# Patient Record
Sex: Female | Born: 1950 | Race: White | Hispanic: No | Smoking: Former smoker
Health system: Southern US, Community
[De-identification: ages and names within clinical notes are randomized; demographics above are authoritative.]

## PROBLEM LIST (undated history)

## (undated) DIAGNOSIS — M1990 Unspecified osteoarthritis, unspecified site: Secondary | ICD-10-CM

## (undated) DIAGNOSIS — F419 Anxiety disorder, unspecified: Secondary | ICD-10-CM

## (undated) DIAGNOSIS — Z87442 Personal history of urinary calculi: Secondary | ICD-10-CM

## (undated) DIAGNOSIS — I10 Essential (primary) hypertension: Secondary | ICD-10-CM

## (undated) DIAGNOSIS — R011 Cardiac murmur, unspecified: Secondary | ICD-10-CM

## (undated) HISTORY — PX: TONSILLECTOMY: SUR1361

## (undated) HISTORY — PX: COLONOSCOPY: SHX174

## (undated) HISTORY — PX: DILATION AND CURETTAGE OF UTERUS: SHX78

## (undated) HISTORY — PX: KNEE ARTHROSCOPY: SUR90

---

## 2000-02-03 ENCOUNTER — Other Ambulatory Visit: Admission: RE | Admit: 2000-02-03 | Discharge: 2000-02-03 | Payer: Self-pay | Admitting: Obstetrics & Gynecology

## 2000-07-18 ENCOUNTER — Emergency Department (HOSPITAL_COMMUNITY): Admission: EM | Admit: 2000-07-18 | Discharge: 2000-07-18 | Payer: Self-pay | Admitting: *Deleted

## 2000-07-18 ENCOUNTER — Encounter: Payer: Self-pay | Admitting: *Deleted

## 2001-02-06 ENCOUNTER — Other Ambulatory Visit: Admission: RE | Admit: 2001-02-06 | Discharge: 2001-02-06 | Payer: Self-pay | Admitting: Obstetrics & Gynecology

## 2002-05-08 ENCOUNTER — Other Ambulatory Visit: Admission: RE | Admit: 2002-05-08 | Discharge: 2002-05-08 | Payer: Self-pay | Admitting: Obstetrics & Gynecology

## 2003-05-27 ENCOUNTER — Other Ambulatory Visit: Admission: RE | Admit: 2003-05-27 | Discharge: 2003-05-27 | Payer: Self-pay | Admitting: Obstetrics & Gynecology

## 2004-05-04 ENCOUNTER — Encounter: Admission: RE | Admit: 2004-05-04 | Discharge: 2004-05-04 | Payer: Self-pay | Admitting: Family Medicine

## 2004-08-09 ENCOUNTER — Other Ambulatory Visit: Admission: RE | Admit: 2004-08-09 | Discharge: 2004-08-09 | Payer: Self-pay | Admitting: Obstetrics & Gynecology

## 2005-05-15 ENCOUNTER — Encounter: Admission: RE | Admit: 2005-05-15 | Discharge: 2005-05-15 | Payer: Self-pay | Admitting: Family Medicine

## 2005-10-11 ENCOUNTER — Other Ambulatory Visit: Admission: RE | Admit: 2005-10-11 | Discharge: 2005-10-11 | Payer: Self-pay | Admitting: Obstetrics & Gynecology

## 2011-09-14 ENCOUNTER — Other Ambulatory Visit: Payer: Self-pay | Admitting: Obstetrics & Gynecology

## 2011-09-14 DIAGNOSIS — N644 Mastodynia: Secondary | ICD-10-CM

## 2011-09-28 ENCOUNTER — Ambulatory Visit
Admission: RE | Admit: 2011-09-28 | Discharge: 2011-09-28 | Disposition: A | Discharge status: Home / Self Care | Payer: 59 | Source: Ambulatory Visit | Attending: Obstetrics & Gynecology | Admitting: Obstetrics & Gynecology

## 2011-09-28 DIAGNOSIS — N644 Mastodynia: Secondary | ICD-10-CM

## 2012-08-24 ENCOUNTER — Other Ambulatory Visit: Payer: Self-pay | Admitting: Obstetrics & Gynecology

## 2012-08-24 DIAGNOSIS — Z1231 Encounter for screening mammogram for malignant neoplasm of breast: Secondary | ICD-10-CM

## 2012-09-28 ENCOUNTER — Ambulatory Visit
Admission: RE | Admit: 2012-09-28 | Discharge: 2012-09-28 | Disposition: A | Discharge status: Home / Self Care | Payer: 59 | Source: Ambulatory Visit | Attending: Obstetrics & Gynecology | Admitting: Obstetrics & Gynecology

## 2012-09-28 DIAGNOSIS — Z1231 Encounter for screening mammogram for malignant neoplasm of breast: Secondary | ICD-10-CM

## 2013-12-02 ENCOUNTER — Other Ambulatory Visit (HOSPITAL_COMMUNITY): Payer: Self-pay | Admitting: Orthopedic Surgery

## 2013-12-02 ENCOUNTER — Encounter (HOSPITAL_COMMUNITY): Payer: Self-pay | Admitting: *Deleted

## 2013-12-03 ENCOUNTER — Ambulatory Visit (HOSPITAL_COMMUNITY)
Admission: RE | Admit: 2013-12-03 | Discharge: 2013-12-03 | Disposition: A | Discharge status: Home / Self Care | Payer: 59 | Source: Ambulatory Visit | Attending: Orthopedic Surgery | Admitting: Orthopedic Surgery

## 2013-12-03 ENCOUNTER — Ambulatory Visit (HOSPITAL_COMMUNITY): Payer: 59

## 2013-12-03 ENCOUNTER — Encounter (HOSPITAL_COMMUNITY): Payer: Self-pay | Admitting: *Deleted

## 2013-12-03 ENCOUNTER — Ambulatory Visit (HOSPITAL_COMMUNITY): Payer: 59 | Admitting: Anesthesiology

## 2013-12-03 ENCOUNTER — Encounter (HOSPITAL_COMMUNITY)
Admission: RE | Disposition: A | Discharge status: Home / Self Care | Payer: Self-pay | Source: Ambulatory Visit | Attending: Orthopedic Surgery

## 2013-12-03 ENCOUNTER — Encounter (HOSPITAL_COMMUNITY): Payer: 59 | Admitting: Anesthesiology

## 2013-12-03 DIAGNOSIS — S42209A Unspecified fracture of upper end of unspecified humerus, initial encounter for closed fracture: Secondary | ICD-10-CM | POA: Insufficient documentation

## 2013-12-03 DIAGNOSIS — W19XXXA Unspecified fall, initial encounter: Secondary | ICD-10-CM | POA: Insufficient documentation

## 2013-12-03 DIAGNOSIS — Z87891 Personal history of nicotine dependence: Secondary | ICD-10-CM | POA: Insufficient documentation

## 2013-12-03 DIAGNOSIS — I10 Essential (primary) hypertension: Secondary | ICD-10-CM | POA: Insufficient documentation

## 2013-12-03 HISTORY — PX: ORIF HUMERUS FRACTURE: SHX2126

## 2013-12-03 HISTORY — DX: Essential (primary) hypertension: I10

## 2013-12-03 HISTORY — DX: Unspecified osteoarthritis, unspecified site: M19.90

## 2013-12-03 LAB — CBC
HEMATOCRIT: 35.7 % — AB (ref 36.0–46.0)
Hemoglobin: 12.2 g/dL (ref 12.0–15.0)
MCH: 30.7 pg (ref 26.0–34.0)
MCHC: 34.2 g/dL (ref 30.0–36.0)
MCV: 89.7 fL (ref 78.0–100.0)
Platelets: 345 10*3/uL (ref 150–400)
RBC: 3.98 MIL/uL (ref 3.87–5.11)
RDW: 12.1 % (ref 11.5–15.5)
WBC: 7.9 10*3/uL (ref 4.0–10.5)

## 2013-12-03 LAB — BASIC METABOLIC PANEL
BUN: 21 mg/dL (ref 6–23)
CHLORIDE: 101 meq/L (ref 96–112)
CO2: 24 meq/L (ref 19–32)
CREATININE: 1.44 mg/dL — AB (ref 0.50–1.10)
Calcium: 9.7 mg/dL (ref 8.4–10.5)
GFR calc Af Amer: 44 mL/min — ABNORMAL LOW (ref >90)
GFR calc non Af Amer: 38 mL/min — ABNORMAL LOW (ref >90)
Glucose, Bld: 114 mg/dL — ABNORMAL HIGH (ref 70–99)
Potassium: 4.3 mEq/L (ref 3.7–5.3)
Sodium: 140 mEq/L (ref 137–147)

## 2013-12-03 SURGERY — OPEN REDUCTION INTERNAL FIXATION (ORIF) PROXIMAL HUMERUS FRACTURE
Anesthesia: General | Site: Shoulder | Laterality: Right

## 2013-12-03 MED ORDER — HYDROMORPHONE HCL PF 1 MG/ML IJ SOLN: 0.2500 mg | INTRAMUSCULAR | Status: DC | PRN

## 2013-12-03 MED ORDER — PROPOFOL 10 MG/ML IV BOLUS
INTRAVENOUS | Status: AC
  Filled 2013-12-03: qty 20

## 2013-12-03 MED ORDER — LACTATED RINGERS IV SOLN
INTRAVENOUS | Status: DC | PRN
  Administered 2013-12-03 (×2): via INTRAVENOUS

## 2013-12-03 MED ORDER — GLYCOPYRROLATE 0.2 MG/ML IJ SOLN
INTRAMUSCULAR | Status: AC
  Filled 2013-12-03: qty 2

## 2013-12-03 MED ORDER — ONDANSETRON HCL 4 MG/2ML IJ SOLN
INTRAMUSCULAR | Status: DC | PRN
  Administered 2013-12-03: 4 mg via INTRAVENOUS

## 2013-12-03 MED ORDER — PROMETHAZINE HCL 25 MG/ML IJ SOLN: 6.2500 mg | INTRAMUSCULAR | Status: DC | PRN

## 2013-12-03 MED ORDER — DEXAMETHASONE SODIUM PHOSPHATE 4 MG/ML IJ SOLN
INTRAMUSCULAR | Status: AC
  Filled 2013-12-03: qty 1

## 2013-12-03 MED ORDER — EPHEDRINE SULFATE 50 MG/ML IJ SOLN
INTRAMUSCULAR | Status: AC
  Filled 2013-12-03: qty 1

## 2013-12-03 MED ORDER — MIDAZOLAM HCL 2 MG/2ML IJ SOLN
INTRAMUSCULAR | Status: AC
  Filled 2013-12-03: qty 2

## 2013-12-03 MED ORDER — OXYCODONE-ACETAMINOPHEN 10-325 MG PO TABS: 1.0000 | ORAL_TABLET | Freq: Four times a day (QID) | ORAL | Status: DC | PRN

## 2013-12-03 MED ORDER — DEXAMETHASONE SODIUM PHOSPHATE 4 MG/ML IJ SOLN
INTRAMUSCULAR | Status: DC | PRN
  Administered 2013-12-03: 4 mg via INTRAVENOUS

## 2013-12-03 MED ORDER — BUPIVACAINE-EPINEPHRINE PF 0.5-1:200000 % IJ SOLN
INTRAMUSCULAR | Status: DC | PRN
  Administered 2013-12-03: 25 mL via PERINEURAL

## 2013-12-03 MED ORDER — MIDAZOLAM HCL 2 MG/2ML IJ SOLN
INTRAMUSCULAR | Status: AC
  Administered 2013-12-03: 1 mg via INTRAVENOUS
  Filled 2013-12-03: qty 2

## 2013-12-03 MED ORDER — GLYCOPYRROLATE 0.2 MG/ML IJ SOLN
INTRAMUSCULAR | Status: DC | PRN
  Administered 2013-12-03: 0.4 mg via INTRAVENOUS

## 2013-12-03 MED ORDER — ROCURONIUM BROMIDE 100 MG/10ML IV SOLN
INTRAVENOUS | Status: DC | PRN
  Administered 2013-12-03: 30 mg via INTRAVENOUS

## 2013-12-03 MED ORDER — PHENYLEPHRINE HCL 10 MG/ML IJ SOLN
INTRAMUSCULAR | Status: DC | PRN
  Administered 2013-12-03 (×2): 80 ug via INTRAVENOUS
  Administered 2013-12-03: 120 ug via INTRAVENOUS

## 2013-12-03 MED ORDER — PROPOFOL 10 MG/ML IV BOLUS
INTRAVENOUS | Status: DC | PRN
  Administered 2013-12-03: 200 mg via INTRAVENOUS

## 2013-12-03 MED ORDER — EPHEDRINE SULFATE 50 MG/ML IJ SOLN
INTRAMUSCULAR | Status: DC | PRN
  Administered 2013-12-03 (×2): 15 mg via INTRAVENOUS
  Administered 2013-12-03: 10 mg via INTRAVENOUS

## 2013-12-03 MED ORDER — LIDOCAINE HCL (CARDIAC) 20 MG/ML IV SOLN
INTRAVENOUS | Status: AC
  Filled 2013-12-03: qty 5

## 2013-12-03 MED ORDER — FENTANYL CITRATE 0.05 MG/ML IJ SOLN
INTRAMUSCULAR | Status: AC
  Administered 2013-12-03: 50 ug via INTRAVENOUS
  Filled 2013-12-03: qty 2

## 2013-12-03 MED ORDER — FENTANYL CITRATE 0.05 MG/ML IJ SOLN
INTRAMUSCULAR | Status: AC
  Filled 2013-12-03: qty 5

## 2013-12-03 MED ORDER — PHENYLEPHRINE HCL 10 MG/ML IJ SOLN
INTRAMUSCULAR | Status: AC
  Filled 2013-12-03: qty 1

## 2013-12-03 MED ORDER — LACTATED RINGERS IV SOLN
INTRAVENOUS | Status: DC
  Administered 2013-12-03: 13:00:00 via INTRAVENOUS

## 2013-12-03 MED ORDER — NEOSTIGMINE METHYLSULFATE 1 MG/ML IJ SOLN
INTRAMUSCULAR | Status: AC
  Filled 2013-12-03: qty 10

## 2013-12-03 MED ORDER — FENTANYL CITRATE 0.05 MG/ML IJ SOLN
50.0000 ug | INTRAMUSCULAR | Status: DC | PRN
  Administered 2013-12-03: 50 ug via INTRAVENOUS

## 2013-12-03 MED ORDER — ONDANSETRON HCL 4 MG/2ML IJ SOLN
INTRAMUSCULAR | Status: AC
  Filled 2013-12-03: qty 2

## 2013-12-03 MED ORDER — PHENYLEPHRINE HCL 10 MG/ML IJ SOLN
10.0000 mg | INTRAVENOUS | Status: DC | PRN
  Administered 2013-12-03: 50 ug/min via INTRAVENOUS

## 2013-12-03 MED ORDER — CEFAZOLIN SODIUM-DEXTROSE 2-3 GM-% IV SOLR
INTRAVENOUS | Status: DC | PRN
  Administered 2013-12-03: 2 g via INTRAVENOUS

## 2013-12-03 MED ORDER — NEOSTIGMINE METHYLSULFATE 1 MG/ML IJ SOLN
INTRAMUSCULAR | Status: DC | PRN
  Administered 2013-12-03: 3 mg via INTRAVENOUS

## 2013-12-03 MED ORDER — MIDAZOLAM HCL 2 MG/2ML IJ SOLN
1.0000 mg | INTRAMUSCULAR | Status: DC | PRN
  Administered 2013-12-03: 1 mg via INTRAVENOUS

## 2013-12-03 MED ORDER — CEFAZOLIN SODIUM-DEXTROSE 2-3 GM-% IV SOLR
INTRAVENOUS | Status: AC
  Filled 2013-12-03: qty 50

## 2013-12-03 MED ORDER — FENTANYL CITRATE 0.05 MG/ML IJ SOLN
INTRAMUSCULAR | Status: DC | PRN
  Administered 2013-12-03: 50 ug via INTRAVENOUS
  Administered 2013-12-03: 100 ug via INTRAVENOUS

## 2013-12-03 MED ORDER — LIDOCAINE HCL (CARDIAC) 20 MG/ML IV SOLN
INTRAVENOUS | Status: DC | PRN
  Administered 2013-12-03: 100 mg via INTRAVENOUS

## 2013-12-03 MED ORDER — STERILE WATER FOR INJECTION IJ SOLN
INTRAMUSCULAR | Status: AC
  Filled 2013-12-03: qty 10

## 2013-12-03 MED ORDER — ROCURONIUM BROMIDE 50 MG/5ML IV SOLN
INTRAVENOUS | Status: AC
  Filled 2013-12-03: qty 1

## 2013-12-03 SURGICAL SUPPLY — 67 items
ANCHOR KNTLS PEEK 4.5 ALLTHRD (Anchor) ×2 IMPLANT
APL SKNCLS STERI-STRIP NONHPOA (GAUZE/BANDAGES/DRESSINGS) ×1
BANDAGE ELASTIC 4 VELCRO ST LF (GAUZE/BANDAGES/DRESSINGS) IMPLANT
BANDAGE ELASTIC 6 VELCRO ST LF (GAUZE/BANDAGES/DRESSINGS) IMPLANT
BENZOIN TINCTURE PRP APPL 2/3 (GAUZE/BANDAGES/DRESSINGS) ×2 IMPLANT
BIT DRILL 2.9 CANN QC NONSTRL (BIT) ×1 IMPLANT
BIT DRILL 2.9X70 QC CALB (BIT) ×1 IMPLANT
BNDG COHESIVE 4X5 TAN STRL (GAUZE/BANDAGES/DRESSINGS) ×2 IMPLANT
CLOTH BEACON ORANGE TIMEOUT ST (SAFETY) ×2 IMPLANT
CLSR STERI-STRIP ANTIMIC 1/2X4 (GAUZE/BANDAGES/DRESSINGS) ×1 IMPLANT
COVER SURGICAL LIGHT HANDLE (MISCELLANEOUS) ×2 IMPLANT
DRAIN PENROSE 1/2X12 LTX STRL (WOUND CARE) IMPLANT
DRAPE C-ARM 42X72 X-RAY (DRAPES) IMPLANT
DRAPE U-SHAPE 47X51 STRL (DRAPES) ×2 IMPLANT
DRSG MEPILEX BORDER 4X8 (GAUZE/BANDAGES/DRESSINGS) ×1 IMPLANT
DRSG PAD ABDOMINAL 8X10 ST (GAUZE/BANDAGES/DRESSINGS) IMPLANT
DURAPREP 26ML APPLICATOR (WOUND CARE) ×2 IMPLANT
ELECT REM PT RETURN 9FT ADLT (ELECTROSURGICAL) ×2
ELECTRODE REM PT RTRN 9FT ADLT (ELECTROSURGICAL) ×1 IMPLANT
FACESHIELD LNG OPTICON STERILE (SAFETY) ×2 IMPLANT
GAUZE XEROFORM 5X9 LF (GAUZE/BANDAGES/DRESSINGS) ×1 IMPLANT
GLOVE BIOGEL PI IND STRL 8 (GLOVE) ×1 IMPLANT
GLOVE BIOGEL PI INDICATOR 8 (GLOVE) ×1
GLOVE SURG ORTHO 8.0 STRL STRW (GLOVE) ×2 IMPLANT
GOWN PREVENTION PLUS LG XLONG (DISPOSABLE) IMPLANT
GOWN PREVENTION PLUS XLARGE (GOWN DISPOSABLE) ×2 IMPLANT
GOWN STRL NON-REIN LRG LVL3 (GOWN DISPOSABLE) ×4 IMPLANT
K-WIRE ACE 1.6X6 (WIRE) ×8
KIT BASIN OR (CUSTOM PROCEDURE TRAY) ×2 IMPLANT
KIT BIO-TENODESIS 3X8 DISP (MISCELLANEOUS) ×2
KIT INSRT BABSR STRL DISP BTN (MISCELLANEOUS) IMPLANT
KIT ROOM TURNOVER OR (KITS) ×2 IMPLANT
KWIRE ACE 1.6X6 (WIRE) IMPLANT
MANIFOLD NEPTUNE II (INSTRUMENTS) ×2 IMPLANT
NDL 1/2 CIR CATGUT .05X1.09 (NEEDLE) IMPLANT
NEEDLE 1/2 CIR CATGUT .05X1.09 (NEEDLE) ×2 IMPLANT
NEEDLE 21X1 OR PACK (NEEDLE) IMPLANT
NS IRRIG 1000ML POUR BTL (IV SOLUTION) ×2 IMPLANT
PACK SHOULDER (CUSTOM PROCEDURE TRAY) ×2 IMPLANT
PAD ARMBOARD 7.5X6 YLW CONV (MISCELLANEOUS) ×4 IMPLANT
PAD CAST 4YDX4 CTTN HI CHSV (CAST SUPPLIES) IMPLANT
PADDING CAST COTTON 4X4 STRL (CAST SUPPLIES)
PENCIL BUTTON HOLSTER BLD 10FT (ELECTRODE) IMPLANT
PLATE SPIDER 16 (Washer) ×1 IMPLANT
SCREW ACE CAN 4.0 38M (Screw) ×1 IMPLANT
SCREW ACE CAN 4.0 44M (Screw) ×1 IMPLANT
SCREW LAG  RD HEAD 4.0 44 LTH (Screw) ×1 IMPLANT
SCREW LAG RD HEAD 4.0 44 LTH (Screw) IMPLANT
SLING ARM IMMOBILIZER LRG (SOFTGOODS) ×1 IMPLANT
SPONGE GAUZE 4X4 12PLY (GAUZE/BANDAGES/DRESSINGS) IMPLANT
SPONGE LAP 4X18 X RAY DECT (DISPOSABLE) ×4 IMPLANT
STAPLER VISISTAT 35W (STAPLE) IMPLANT
STOCKINETTE IMPERVIOUS 9X36 MD (GAUZE/BANDAGES/DRESSINGS) IMPLANT
SUCTION FRAZIER TIP 10 FR DISP (SUCTIONS) IMPLANT
SUT FIBERWIRE #2 38 T-5 BLUE (SUTURE) ×6
SUT PROLENE 3 0 PS 2 (SUTURE) ×1 IMPLANT
SUT VIC AB 0 CT1 27 (SUTURE) ×2
SUT VIC AB 0 CT1 27XBRD ANBCTR (SUTURE) IMPLANT
SUT VIC AB 2-0 CTB1 (SUTURE) IMPLANT
SUTURE FIBERWR #2 38 T-5 BLUE (SUTURE) IMPLANT
TOWEL OR 17X24 6PK STRL BLUE (TOWEL DISPOSABLE) ×2 IMPLANT
TOWEL OR 17X26 10 PK STRL BLUE (TOWEL DISPOSABLE) ×2 IMPLANT
TUBE CONNECTING 12X1/4 (SUCTIONS) IMPLANT
WASHER FLAT ACE (Orthopedic Implant) ×1 IMPLANT
WASHER PLAIN FLAT ACE NS 3PK (Orthopedic Implant) IMPLANT
WATER STERILE IRR 1000ML POUR (IV SOLUTION) ×2 IMPLANT
YANKAUER SUCT BULB TIP NO VENT (SUCTIONS) IMPLANT

## 2013-12-03 NOTE — Anesthesia Preprocedure Evaluation (Addendum)
Anesthesia Evaluation  Patient identified by MRN, date of birth, ID band Patient awake    Reviewed: Allergy & Precautions, H&P , NPO status , Patient's Chart, lab work & pertinent test results  History of Anesthesia Complications Negative for: history of anesthetic complications  Airway Mallampati: II TM Distance: >3 FB Neck ROM: Full    Dental  (+) Teeth Intact and Dental Advisory Given   Pulmonary former smoker,  breath sounds clear to auscultation        Cardiovascular hypertension, Rhythm:Regular Rate:Normal     Neuro/Psych    GI/Hepatic   Endo/Other    Renal/GU      Musculoskeletal   Abdominal   Peds  Hematology   Anesthesia Other Findings   Reproductive/Obstetrics                          Anesthesia Physical Anesthesia Plan  ASA: II  Anesthesia Plan:    Post-op Pain Management:    Induction: Intravenous  Airway Management Planned: Oral ETT  Additional Equipment:   Intra-op Plan:   Post-operative Plan: Extubation in OR  Informed Consent: I have reviewed the patients History and Physical, chart, labs and discussed the procedure including the risks, benefits and alternatives for the proposed anesthesia with the patient or authorized representative who has indicated his/her understanding and acceptance.   Dental advisory given  Plan Discussed with: CRNA and Surgeon  Anesthesia Plan Comments:         Anesthesia Quick Evaluation

## 2013-12-03 NOTE — Anesthesia Procedure Notes (Addendum)
Anesthesia Regional Block:  Supraclavicular block  Pre-Anesthetic Checklist: ,, timeout performed, Correct Patient, Correct Site, Correct Laterality, Correct Procedure, Correct Position, site marked, Risks and benefits discussed,  Surgical consent,  Pre-op evaluation,  At surgeon's request and post-op pain management  Laterality: Right and Upper  Prep: chloraprep       Needles:   Needle Type: Echogenic Needle     Needle Length: 5cm 5 cm Needle Gauge: 22 and 22 G  Needle insertion depth: 3 cm   Additional Needles:  Procedures: ultrasound guided (picture in chart) Supraclavicular block Narrative:  Start time: 12/03/2013 2:05 PM End time: 12/03/2013 2:20 PM Injection made incrementally with aspirations every 5 mL.  Performed by: Personally  Anesthesiologist: TMassagee  Additional Notes: Monitors applied, sedation begun, tolerated well   Procedure Name: Intubation Date/Time: 12/03/2013 3:25 PM Performed by: Orvilla FusATO, Autumne Kallio A Pre-anesthesia Checklist: Patient identified, Timeout performed, Emergency Drugs available, Suction available and Patient being monitored Patient Re-evaluated:Patient Re-evaluated prior to inductionOxygen Delivery Method: Circle system utilized Preoxygenation: Pre-oxygenation with 100% oxygen Intubation Type: IV induction Ventilation: Mask ventilation without difficulty Laryngoscope Size: Mac and 3 Grade View: Grade I Tube type: Oral Tube size: 7.0 mm Number of attempts: 1 Airway Equipment and Method: Stylet Placement Confirmation: ETT inserted through vocal cords under direct vision,  breath sounds checked- equal and bilateral and positive ETCO2 Secured at: 21 cm Tube secured with: Tape Dental Injury: Teeth and Oropharynx as per pre-operative assessment

## 2013-12-03 NOTE — Transfer of Care (Signed)
Immediate Anesthesia Transfer of Care Note  Patient: Amy Dunlap  Procedure(s) Performed: Procedure(s): OPEN REDUCTION INTERNAL FIXATION (ORIF) PROXIMAL HUMERUS FRACTURE (Right)  Patient Location: PACU  Anesthesia Type:General  Level of Consciousness: awake, alert  and oriented  Airway & Oxygen Therapy: Patient Spontanous Breathing and Patient connected to nasal cannula oxygen  Post-op Assessment: Report given to PACU RN, Post -op Vital signs reviewed and stable and Patient moving all extremities  Post vital signs: Reviewed and stable  Complications: No apparent anesthesia complications

## 2013-12-03 NOTE — Brief Op Note (Signed)
12/03/2013  5:28 PM  PATIENT:  Amy HolmesAnna W Slager  63 y.o. female  PRE-OPERATIVE DIAGNOSIS:  RIGHT SHOULDER PROXIMAL HUMERUS FRACTURE  POST-OPERATIVE DIAGNOSIS:  RIGHT SHOULDER PROXIMAL HUMERUS FRACTURE  PROCEDURE:  Procedure(s): OPEN REDUCTION INTERNAL FIXATION (ORIF) PROXIMAL HUMERUS FRACTURE  SURGEON:  Surgeon(s): Cammy CopaGregory Scott Sheriann Newmann, MD  ASSISTANT: s vernon pa  ANESTHESIA:   general  EBL: 50 ml    Total I/O In: 1400 [I.V.:1400] Out: 200 [Blood:200]  BLOOD ADMINISTERED: none  DRAINS: none   LOCAL MEDICATIONS USED:  none  SPECIMEN:  No Specimen  COUNTS:  YES  TOURNIQUET:  * No tourniquets in log *  DICTATION: .Other Dictation: Dictation Number [id number]  PLAN OF CARE: Discharge to home after PACU  PATIENT DISPOSITION:  PACU - hemodynamically stable

## 2013-12-03 NOTE — Anesthesia Postprocedure Evaluation (Signed)
Anesthesia Post Note  Patient: Amy Dunlap  Procedure(s) Performed: Procedure(s) (LRB): OPEN REDUCTION INTERNAL FIXATION (ORIF) PROXIMAL HUMERUS FRACTURE (Right)  Anesthesia type: general  Patient location: PACU  Post pain: Pain level controlled  Post assessment: Patient's Cardiovascular Status Stable  Last Vitals:  Filed Vitals:   12/03/13 1815  BP: 117/82  Pulse: 83  Temp:   Resp: 16    Post vital signs: Reviewed and stable  Level of consciousness: sedated  Complications: No apparent anesthesia complications

## 2013-12-03 NOTE — Preoperative (Signed)
Beta Blockers   Reason not to administer Beta Blockers:Not Applicable 

## 2013-12-03 NOTE — H&P (Signed)
Amy Dunlap is an 63 y.o. female.   Chief Complaint:  Right arm pain HPI: And is a 63 year old female with right arm pain she sustained a fall 9 days ago. She was noted have a displaced greater tuberosity fracture. She presents now for operative management after explanation risk and benefits. She denies any other orthopedic complaints  Past Medical History  Diagnosis Date  . Hypertension   . Arthritis   . Kidney stones     Hx: of    Past Surgical History  Procedure Laterality Date  . Knee arthroscopy      Hx: of right knee  . Colonoscopy      Hx: of  . Tonsillectomy    . Dilation and curettage of uterus      Family History  Problem Relation Age of Onset  . Hypertension Mother   . Heart attack Mother   . Cancer - Other Sister    Social History:  reports that she has quit smoking. She has never used smokeless tobacco. She reports that she drinks alcohol. She reports that she does not use illicit drugs.  Allergies:  Allergies  Allergen Reactions  . Hydrocodone Nausea And Vomiting    No prescriptions prior to admission    No results found for this or any previous visit (from the past 48 hour(s)). No results found.  Review of Systems  Constitutional: Negative.   HENT: Negative.   Eyes: Negative.   Respiratory: Negative.   Cardiovascular: Negative.   Gastrointestinal: Negative.   Genitourinary: Negative.   Musculoskeletal: Positive for joint pain.  Skin: Negative.   Neurological: Negative.   Endo/Heme/Allergies: Negative.   Psychiatric/Behavioral: Negative.     There were no vitals taken for this visit. Physical Exam  Constitutional: She appears well-developed.  HENT:  Head: Normocephalic.  Eyes: Pupils are equal, round, and reactive to light.  Neck: Normal range of motion.  Cardiovascular: Normal rate.   Respiratory: Effort normal.  Neurological: She is alert.  Skin: Skin is warm.  Psychiatric: She has a normal mood and affect.   examination the right  shoulder demonstrates ecchymosis and bruising proximally. Motor sensory function hand is intact. Weakness is noted to supraspinatus testing. No paresthesias in the arm. Deltoid does fire.  Assessment/Plan Impression is displaced greater tuberosity fracture plan impression internal fixation risks benefits discussed including but limited to infection nerve vessel damage incomplete healing bone quality could potentially be an issue all questions answered if bone quality is good and secure  fixation can be achieved we'll begin range of motion early.  DEAN,GREGORY SCOTT 12/03/2013, 11:16 AM

## 2013-12-04 NOTE — Op Note (Signed)
NAMSherrie Dunlap:  Amy Dunlap, Amy Dunlap                  ACCOUNT NO.:  [id number]  MEDICAL RECORD NO.:  [id number]  LOCATION:  [facility]                         FACILITY:  [facility]  PHYSICIAN:  Burnard BuntingG. Scott Unknown Flannigan, M.D.    DATE OF BIRTH:  [birth year]  DATE OF PROCEDURE: DATE OF DISCHARGE:  12/03/2013                              OPERATIVE REPORT   PREOPERATIVE DIAGNOSIS:  Right proximal humerus fracture.  POSTOPERATIVE DIAGNOSIS:  Right proximal humerus fracture.  PROCEDURE:  Right proximal humerus fracture open reduction and internal fixation.  SURGEON:  Burnard BuntingG. Scott Hayven Croy, M.D.  ASSISTANT:  Wende NeighborsSheila M. Vernon, P.A.  ANESTHESIA:  General endotracheal.  ESTIMATED BLOOD LOSS:  50 mL.  INDICATIONS:  [name] is a patient with right proximal humerus fracture displaced who presents for operative management after explanation of risks and benefits.  PROCEDURE IN DETAIL:  The patient was brought to the operating room where general endotracheal anesthesia was induced.  Preop antibiotics were administered.  Time-out was called.  The patient was placed in a beach-chair position with her head in neutral position.  Right arm and shoulder were prescrubbed with alcohol and Betadine, which was allowed to air dry.  Prepped with DuraPrep solution, and draped in sterile manner using Ioban.  Incision was made over the midportion of the acromion.  Skin, subcutaneous tissue were sharply divided.  Deltoid split was made 4.5 cm measured distance from the acromion and marked with #1 Vicryl suture.  Deltoid split was performed, fracture was identified.  Bursa was removed.  Care was taken to avoid injury to the axillary nerve.  Fracture was reduced.  Bone was what appeared to be 1 piece on x-ray was actually several pieces, the greater tuberosity was comminuted.  Three #2 FiberWire sutures were placed to the tendon bone junction of the fragments.  The largest fragment was reduced and secured with a 4-0 cancellous screw and washer.  The  other large fragment was secured with a second 4-0 screw, which was placed below the rotator cuff.  Bone quality was poor.  The sutures were then secured to the humeral head __________ region using Biomet corkscrews.  Three sutures placed in each limb, and good fixation and tension was achieved.  At this time, thorough irrigation was performed.  Axillary nerve was palpated below the incision.  The deltoid split was closed using #1 Vicryl suture followed by interrupted inverted 2-0 Vicryl suture and 3-0 Prolene.  Velna HatchetSheila Vernon's assistance was required during the case for retraction and limb positioning, protection of neurovascular structures.  Her assistance was a medical necessity.  The patient was placed in a bulky sling.  Under fluoroscopy the fracture reduction looked good.  The patient will be discharged to home with pain medicine.     Burnard BuntingG. Scott Vir Whetstine, M.D.     GSD/MEDQ  D:  12/03/2013  T:  12/04/2013  Job:  [id number]

## 2013-12-17 ENCOUNTER — Encounter (HOSPITAL_COMMUNITY): Payer: Self-pay | Admitting: Orthopedic Surgery

## 2014-01-20 ENCOUNTER — Encounter (HOSPITAL_COMMUNITY): Payer: Self-pay

## 2014-01-24 ENCOUNTER — Encounter (HOSPITAL_COMMUNITY): Payer: Self-pay

## 2014-01-24 ENCOUNTER — Encounter (HOSPITAL_COMMUNITY)
Admission: RE | Admit: 2014-01-24 | Discharge: 2014-01-24 | Disposition: A | Discharge status: Home / Self Care | Payer: 59 | Source: Ambulatory Visit | Attending: Orthopedic Surgery | Admitting: Orthopedic Surgery

## 2014-01-24 DIAGNOSIS — Z01812 Encounter for preprocedural laboratory examination: Secondary | ICD-10-CM | POA: Insufficient documentation

## 2014-01-24 HISTORY — DX: Personal history of urinary calculi: Z87.442

## 2014-01-24 HISTORY — DX: Cardiac murmur, unspecified: R01.1

## 2014-01-24 HISTORY — DX: Anxiety disorder, unspecified: F41.9

## 2014-01-24 LAB — CBC
HCT: 34.7 % — ABNORMAL LOW (ref 36.0–46.0)
HEMOGLOBIN: 11.8 g/dL — AB (ref 12.0–15.0)
MCH: 30.2 pg (ref 26.0–34.0)
MCHC: 34 g/dL (ref 30.0–36.0)
MCV: 88.7 fL (ref 78.0–100.0)
PLATELETS: 227 10*3/uL (ref 150–400)
RBC: 3.91 MIL/uL (ref 3.87–5.11)
RDW: 12.2 % (ref 11.5–15.5)
WBC: 5.5 10*3/uL (ref 4.0–10.5)

## 2014-01-24 LAB — BASIC METABOLIC PANEL
BUN: 20 mg/dL (ref 6–23)
CO2: 25 meq/L (ref 19–32)
Calcium: 9.6 mg/dL (ref 8.4–10.5)
Chloride: 102 mEq/L (ref 96–112)
Creatinine, Ser: 1.17 mg/dL — ABNORMAL HIGH (ref 0.50–1.10)
GFR calc Af Amer: 56 mL/min — ABNORMAL LOW (ref >90)
GFR, EST NON AFRICAN AMERICAN: 49 mL/min — AB (ref >90)
GLUCOSE: 104 mg/dL — AB (ref 70–99)
POTASSIUM: 4.2 meq/L (ref 3.7–5.3)
Sodium: 141 mEq/L (ref 137–147)

## 2014-01-24 MED ORDER — CEFAZOLIN SODIUM-DEXTROSE 2-3 GM-% IV SOLR: 2.0000 g | INTRAVENOUS | Status: DC

## 2014-01-24 MED ORDER — CHLORHEXIDINE GLUCONATE 4 % EX LIQD: 60.0000 mL | Freq: Once | CUTANEOUS | Status: DC

## 2014-01-24 NOTE — Pre-Procedure Instructions (Addendum)
Janeal Holmesnna W Blecha  01/24/2014   Your procedure is scheduled on:  02/04/14  [facility] at 530 AM.  Call this number if you have problems the morning of surgery: [phone]   Remember:   Do not eat food or drink liquids after midnight.   Take these medicines the morning of surgery with A SIP OF WATER: lexapro,bactrim if not completed      STOP all herbel meds, nsaids (aleve,naproxen,advil,ibuprofen) 5 days prior to surgery including vitamins, aspirin   Do not wear jewelry, make-up or nail polish.  Do not wear lotions, powders, or perfumes. You may wear deodorant.  Do not shave 48 hours prior to surgery. Men may shave face and neck.  Do not bring valuables to the hospital.  [facility]                  for any belongings or valuables.               Contacts, dentures or bridgework may not be worn into surgery.  Leave suitcase in the car. After surgery it may be brought to your room.  For patients admitted to the hospital, discharge time is determined by your                treatment team.               Patients discharged the day of surgery will not be allowed to drive  home.  Name and phone number of your driver:   Special Instructions:  Special Instructions: [facility] - Preparing for Surgery  Before surgery, you can play an important role.  Because skin is not sterile, your skin needs to be as free of germs as possible.  You can reduce the number of germs on you skin by washing with CHG (chlorahexidine gluconate) soap before surgery.  CHG is an antiseptic cleaner which kills germs and bonds with the skin to continue killing germs even after washing.  Please DO NOT use if you have an allergy to CHG or antibacterial soaps.  If your skin becomes reddened/irritated stop using the CHG and inform your nurse when you arrive at Short Stay.  Do not shave (including legs and underarms) for at least 48 hours prior to the first CHG shower.  You may  shave your face.  Please follow these instructions carefully:   1.  Shower with CHG Soap the night before surgery and the morning of Surgery.  2.  If you choose to wash your hair, wash your hair first as usual with your normal shampoo.  3.  After you shampoo, rinse your hair and body thoroughly to remove the Shampoo.  4.  Use CHG as you would any other liquid soap.  You can apply chg directly  to the skin and wash gently with scrungie or a clean washcloth.  5.  Apply the CHG Soap to your body ONLY FROM THE NECK DOWN.  Do not use on open wounds or open sores.  Avoid contact with your eyes ears, mouth and genitals (private parts).  Wash genitals (private parts)       with your normal soap.  6.  Wash thoroughly, paying special attention to the area where your surgery will be performed.  7.  Thoroughly rinse your body with warm water from the neck down.  8.  DO NOT shower/wash with your normal soap after using and rinsing off the CHG Soap.  9.  Pat yourself dry with a clean towel.            10.  Wear clean pajamas.            11.  Place clean sheets on your bed the night of your first shower and do not sleep with pets.  Day of Surgery  Do not apply any lotions/deodorants the morning of surgery.  Please wear clean clothes to the hospital/surgery center.   Please read over the following fact sheets that you were given: Pain Booklet, Coughing and Deep Breathing and Surgical Site Infection Prevention

## 2014-01-24 NOTE — Progress Notes (Signed)
No orders. Office called.office closed. Please call mon 01/27/14 for orders

## 2014-01-27 ENCOUNTER — Other Ambulatory Visit (HOSPITAL_COMMUNITY): Payer: Self-pay | Admitting: Orthopedic Surgery

## 2014-01-27 NOTE — Progress Notes (Signed)
Notified Kim at Dr. Diamantina Providenceean's office of patient not having active orders in epic.

## 2014-02-03 MED ORDER — CHLORHEXIDINE GLUCONATE 4 % EX LIQD
60.0000 mL | Freq: Once | CUTANEOUS | Status: DC
  Filled 2014-02-03: qty 60

## 2014-02-03 MED ORDER — CEFAZOLIN SODIUM-DEXTROSE 2-3 GM-% IV SOLR
2.0000 g | INTRAVENOUS | Status: AC
  Administered 2014-02-04: 2 g via INTRAVENOUS
  Filled 2014-02-03: qty 50

## 2014-02-03 NOTE — Progress Notes (Signed)
Spoke with pt and informed her of new arrival time of [id number] with same other pre-op instructions. Pt states understanding.

## 2014-02-04 ENCOUNTER — Ambulatory Visit (HOSPITAL_COMMUNITY): Payer: 59 | Admitting: Critical Care Medicine

## 2014-02-04 ENCOUNTER — Encounter (HOSPITAL_COMMUNITY): Payer: Self-pay | Admitting: Critical Care Medicine

## 2014-02-04 ENCOUNTER — Encounter (HOSPITAL_COMMUNITY)
Admission: RE | Disposition: A | Discharge status: Home / Self Care | Payer: Self-pay | Source: Ambulatory Visit | Attending: Orthopedic Surgery

## 2014-02-04 ENCOUNTER — Encounter (HOSPITAL_COMMUNITY): Payer: 59 | Admitting: Critical Care Medicine

## 2014-02-04 ENCOUNTER — Ambulatory Visit (HOSPITAL_COMMUNITY): Payer: 59

## 2014-02-04 ENCOUNTER — Ambulatory Visit (HOSPITAL_COMMUNITY)
Admission: RE | Admit: 2014-02-04 | Discharge: 2014-02-04 | Disposition: A | Discharge status: Home / Self Care | Payer: 59 | Source: Ambulatory Visit | Attending: Orthopedic Surgery | Admitting: Orthopedic Surgery

## 2014-02-04 DIAGNOSIS — F411 Generalized anxiety disorder: Secondary | ICD-10-CM | POA: Insufficient documentation

## 2014-02-04 DIAGNOSIS — M810 Age-related osteoporosis without current pathological fracture: Secondary | ICD-10-CM | POA: Insufficient documentation

## 2014-02-04 DIAGNOSIS — R011 Cardiac murmur, unspecified: Secondary | ICD-10-CM | POA: Insufficient documentation

## 2014-02-04 DIAGNOSIS — S4290XA Fracture of unspecified shoulder girdle, part unspecified, initial encounter for closed fracture: Secondary | ICD-10-CM

## 2014-02-04 DIAGNOSIS — Z472 Encounter for removal of internal fixation device: Secondary | ICD-10-CM | POA: Insufficient documentation

## 2014-02-04 DIAGNOSIS — Z87891 Personal history of nicotine dependence: Secondary | ICD-10-CM | POA: Insufficient documentation

## 2014-02-04 DIAGNOSIS — I10 Essential (primary) hypertension: Secondary | ICD-10-CM | POA: Insufficient documentation

## 2014-02-04 HISTORY — PX: HARDWARE REMOVAL: SHX979

## 2014-02-04 SURGERY — REMOVAL, HARDWARE
Anesthesia: General | Site: Shoulder | Laterality: Right

## 2014-02-04 MED ORDER — ONDANSETRON HCL 4 MG/2ML IJ SOLN: 4.0000 mg | Freq: Once | INTRAMUSCULAR | Status: DC | PRN

## 2014-02-04 MED ORDER — ROCURONIUM BROMIDE 100 MG/10ML IV SOLN
INTRAVENOUS | Status: DC | PRN
  Administered 2014-02-04: 50 mg via INTRAVENOUS

## 2014-02-04 MED ORDER — PROPOFOL 10 MG/ML IV BOLUS
INTRAVENOUS | Status: DC | PRN
  Administered 2014-02-04: 100 mg via INTRAVENOUS

## 2014-02-04 MED ORDER — LIDOCAINE HCL (CARDIAC) 20 MG/ML IV SOLN
INTRAVENOUS | Status: DC | PRN
  Administered 2014-02-04: 100 mg via INTRAVENOUS

## 2014-02-04 MED ORDER — LIDOCAINE HCL (CARDIAC) 20 MG/ML IV SOLN
INTRAVENOUS | Status: AC
  Filled 2014-02-04: qty 5

## 2014-02-04 MED ORDER — NEOSTIGMINE METHYLSULFATE 1 MG/ML IJ SOLN
INTRAMUSCULAR | Status: DC | PRN
  Administered 2014-02-04: 4 mg via INTRAVENOUS

## 2014-02-04 MED ORDER — OXYCODONE-ACETAMINOPHEN 10-325 MG PO TABS: 1.0000 | ORAL_TABLET | Freq: Four times a day (QID) | ORAL | Status: DC | PRN

## 2014-02-04 MED ORDER — MIDAZOLAM HCL 2 MG/2ML IJ SOLN
INTRAMUSCULAR | Status: AC
  Filled 2014-02-04: qty 2

## 2014-02-04 MED ORDER — BUPIVACAINE-EPINEPHRINE PF 0.5-1:200000 % IJ SOLN
INTRAMUSCULAR | Status: DC | PRN
  Administered 2014-02-04: 30 mL via PERINEURAL

## 2014-02-04 MED ORDER — DEXAMETHASONE SODIUM PHOSPHATE 4 MG/ML IJ SOLN
INTRAMUSCULAR | Status: AC
  Filled 2014-02-04: qty 1

## 2014-02-04 MED ORDER — PROPOFOL 10 MG/ML IV BOLUS
INTRAVENOUS | Status: AC
  Filled 2014-02-04: qty 20

## 2014-02-04 MED ORDER — FENTANYL CITRATE 0.05 MG/ML IJ SOLN
INTRAMUSCULAR | Status: DC | PRN
  Administered 2014-02-04: 50 ug via INTRAVENOUS
  Administered 2014-02-04: 100 ug via INTRAVENOUS

## 2014-02-04 MED ORDER — FENTANYL CITRATE 0.05 MG/ML IJ SOLN
INTRAMUSCULAR | Status: AC
  Filled 2014-02-04: qty 5

## 2014-02-04 MED ORDER — OXYCODONE HCL 5 MG/5ML PO SOLN: 5.0000 mg | Freq: Once | ORAL | Status: DC | PRN

## 2014-02-04 MED ORDER — MEPERIDINE HCL 25 MG/ML IJ SOLN: 6.2500 mg | INTRAMUSCULAR | Status: DC | PRN

## 2014-02-04 MED ORDER — HYDROMORPHONE HCL PF 1 MG/ML IJ SOLN
INTRAMUSCULAR | Status: AC
  Filled 2014-02-04: qty 1

## 2014-02-04 MED ORDER — GLYCOPYRROLATE 0.2 MG/ML IJ SOLN
INTRAMUSCULAR | Status: AC
  Filled 2014-02-04: qty 1

## 2014-02-04 MED ORDER — MIDAZOLAM HCL 5 MG/5ML IJ SOLN
INTRAMUSCULAR | Status: DC | PRN
  Administered 2014-02-04 (×2): 1 mg via INTRAVENOUS

## 2014-02-04 MED ORDER — OXYCODONE HCL 5 MG PO TABS: 5.0000 mg | ORAL_TABLET | Freq: Once | ORAL | Status: DC | PRN

## 2014-02-04 MED ORDER — DEXAMETHASONE SODIUM PHOSPHATE 4 MG/ML IJ SOLN
INTRAMUSCULAR | Status: DC | PRN
  Administered 2014-02-04: 4 mg via INTRAVENOUS

## 2014-02-04 MED ORDER — HYDROMORPHONE HCL PF 1 MG/ML IJ SOLN: 0.2500 mg | INTRAMUSCULAR | Status: DC | PRN

## 2014-02-04 MED ORDER — ONDANSETRON HCL 4 MG/2ML IJ SOLN
INTRAMUSCULAR | Status: DC | PRN
  Administered 2014-02-04: 4 mg via INTRAVENOUS

## 2014-02-04 MED ORDER — GLYCOPYRROLATE 0.2 MG/ML IJ SOLN
INTRAMUSCULAR | Status: DC | PRN
  Administered 2014-02-04: 0.1 mg via INTRAVENOUS
  Administered 2014-02-04: 0.6 mg via INTRAVENOUS

## 2014-02-04 MED ORDER — ROCURONIUM BROMIDE 50 MG/5ML IV SOLN
INTRAVENOUS | Status: AC
  Filled 2014-02-04: qty 1

## 2014-02-04 MED ORDER — 0.9 % SODIUM CHLORIDE (POUR BTL) OPTIME
TOPICAL | Status: DC | PRN
  Administered 2014-02-04: 1000 mL

## 2014-02-04 MED ORDER — GLYCOPYRROLATE 0.2 MG/ML IJ SOLN
INTRAMUSCULAR | Status: AC
  Filled 2014-02-04: qty 3

## 2014-02-04 MED ORDER — LACTATED RINGERS IV SOLN
INTRAVENOUS | Status: DC
  Administered 2014-02-04 (×2): via INTRAVENOUS

## 2014-02-04 MED ORDER — NEOSTIGMINE METHYLSULFATE 1 MG/ML IJ SOLN
INTRAMUSCULAR | Status: AC
  Filled 2014-02-04: qty 10

## 2014-02-04 MED ORDER — PHENYLEPHRINE HCL 10 MG/ML IJ SOLN
10.0000 mg | INTRAVENOUS | Status: DC | PRN
  Administered 2014-02-04: 50 ug/min via INTRAVENOUS

## 2014-02-04 MED ORDER — ONDANSETRON HCL 4 MG PO TABS: 4.0000 mg | ORAL_TABLET | Freq: Three times a day (TID) | ORAL | Status: DC | PRN

## 2014-02-04 SURGICAL SUPPLY — 48 items
BANDAGE ELASTIC 4 VELCRO ST LF (GAUZE/BANDAGES/DRESSINGS) IMPLANT
BANDAGE ELASTIC 6 VELCRO ST LF (GAUZE/BANDAGES/DRESSINGS) IMPLANT
BANDAGE ESMARK 6X9 LF (GAUZE/BANDAGES/DRESSINGS) IMPLANT
BANDAGE GAUZE ELAST BULKY 4 IN (GAUZE/BANDAGES/DRESSINGS) ×2 IMPLANT
BNDG CMPR 9X6 STRL LF SNTH (GAUZE/BANDAGES/DRESSINGS)
BNDG COHESIVE 4X5 TAN STRL (GAUZE/BANDAGES/DRESSINGS) IMPLANT
BNDG ESMARK 6X9 LF (GAUZE/BANDAGES/DRESSINGS)
CLSR STERI-STRIP ANTIMIC 1/2X4 (GAUZE/BANDAGES/DRESSINGS) ×1 IMPLANT
COVER SURGICAL LIGHT HANDLE (MISCELLANEOUS) ×2 IMPLANT
CUFF TOURNIQUET SINGLE 34IN LL (TOURNIQUET CUFF) IMPLANT
CUFF TOURNIQUET SINGLE 44IN (TOURNIQUET CUFF) IMPLANT
DRAPE C-ARM 42X72 X-RAY (DRAPES) IMPLANT
DRAPE EXTREMITY T 121X128X90 (DRAPE) IMPLANT
DRAPE INCISE IOBAN 66X45 STRL (DRAPES) IMPLANT
DRAPE ORTHO SPLIT 77X108 STRL (DRAPES)
DRAPE PROXIMA HALF (DRAPES) IMPLANT
DRAPE SURG ORHT 6 SPLT 77X108 (DRAPES) IMPLANT
DRSG EMULSION OIL 3X3 NADH (GAUZE/BANDAGES/DRESSINGS) ×2 IMPLANT
DRSG MEPILEX BORDER 4X4 (GAUZE/BANDAGES/DRESSINGS) ×1 IMPLANT
DRSG PAD ABDOMINAL 8X10 ST (GAUZE/BANDAGES/DRESSINGS) ×2 IMPLANT
ELECT REM PT RETURN 9FT ADLT (ELECTROSURGICAL) ×2
ELECTRODE REM PT RTRN 9FT ADLT (ELECTROSURGICAL) ×1 IMPLANT
GAUZE XEROFORM 1X8 LF (GAUZE/BANDAGES/DRESSINGS) ×2 IMPLANT
GLOVE BIOGEL PI IND STRL 8 (GLOVE) ×1 IMPLANT
GLOVE BIOGEL PI INDICATOR 8 (GLOVE) ×1
GLOVE SURG ORTHO 8.0 STRL STRW (GLOVE) ×2 IMPLANT
GOWN STRL REUS W/ TWL LRG LVL3 (GOWN DISPOSABLE) ×3 IMPLANT
GOWN STRL REUS W/TWL LRG LVL3 (GOWN DISPOSABLE) ×6
KIT BASIN OR (CUSTOM PROCEDURE TRAY) ×2 IMPLANT
KIT ROOM TURNOVER OR (KITS) ×2 IMPLANT
MANIFOLD NEPTUNE II (INSTRUMENTS) ×2 IMPLANT
NS IRRIG 1000ML POUR BTL (IV SOLUTION) ×2 IMPLANT
PACK GENERAL/GYN (CUSTOM PROCEDURE TRAY) ×2 IMPLANT
PAD ARMBOARD 7.5X6 YLW CONV (MISCELLANEOUS) ×4 IMPLANT
PAD CAST 4YDX4 CTTN HI CHSV (CAST SUPPLIES) ×2 IMPLANT
PADDING CAST COTTON 4X4 STRL (CAST SUPPLIES) ×4
PADDING CAST COTTON 6X4 STRL (CAST SUPPLIES) ×4 IMPLANT
SPONGE GAUZE 4X4 12PLY (GAUZE/BANDAGES/DRESSINGS) ×2 IMPLANT
STAPLER VISISTAT 35W (STAPLE) ×2 IMPLANT
STOCKINETTE IMPERVIOUS 9X36 MD (GAUZE/BANDAGES/DRESSINGS) IMPLANT
SUT ETHILON 4 0 FS 1 (SUTURE) IMPLANT
SUT VIC AB 0 CT1 27 (SUTURE)
SUT VIC AB 0 CT1 27XBRD ANBCTR (SUTURE) IMPLANT
SUT VIC AB 2-0 CT1 27 (SUTURE)
SUT VIC AB 2-0 CT1 TAPERPNT 27 (SUTURE) IMPLANT
TOWEL OR 17X24 6PK STRL BLUE (TOWEL DISPOSABLE) ×2 IMPLANT
TOWEL OR 17X26 10 PK STRL BLUE (TOWEL DISPOSABLE) ×2 IMPLANT
WATER STERILE IRR 1000ML POUR (IV SOLUTION) ×2 IMPLANT

## 2014-02-04 NOTE — Anesthesia Postprocedure Evaluation (Signed)
Anesthesia Post Note  Patient: Amy Dunlap  Procedure(s) Performed: Procedure(s) (LRB): HARDWARE REMOVAL (Right)  Anesthesia type: general  Patient location: PACU  Post pain: Pain level controlled  Post assessment: Patient's Cardiovascular Status Stable  Last Vitals:  Filed Vitals:   02/04/14 1317  BP: 122/74  Pulse: 63  Temp:   Resp: 19    Post vital signs: Reviewed and stable  Level of consciousness: sedated  Complications: No apparent anesthesia complications

## 2014-02-04 NOTE — Brief Op Note (Signed)
02/04/2014  11:52 AM  PATIENT:  Amy HolmesAnna W Nolasco  63 y.o. female  PRE-OPERATIVE DIAGNOSIS:  RIGHT SHOULDER RETAINED HARDWARE  POST-OPERATIVE DIAGNOSIS:  RIGHT SHOULDER RETAINED HARDWARE  PROCEDURE:  Procedure(s): HARDWARE REMOVAL  SURGEON:  Surgeon(s): Cammy CopaGregory Scott Kamali Sakata, MD  ASSISTANT: none  ANESTHESIA:   general  EBL: 25 ml    Total I/O In: 500 [I.V.:500] Out: -   BLOOD ADMINISTERED: none  DRAINS: none   LOCAL MEDICATIONS USED:  none  SPECIMEN:  No Specimen  COUNTS:  YES  TOURNIQUET:  * No tourniquets in log *  DICTATION: .Other Dictation: Dictation Number [id number]  PLAN OF CARE: Discharge to home after PACU  PATIENT DISPOSITION:  PACU - hemodynamically stable

## 2014-02-04 NOTE — Discharge Instructions (Signed)
What to eat: ° °For your first meals, you should eat lightly; only small meals initially.  If you do not have nausea, you may eat larger meals.  Avoid spicy, greasy and heavy food.   ° °General Anesthesia, Adult, Care After  °Refer to this sheet in the next few weeks. These instructions provide you with information on caring for yourself after your procedure. Your health care provider may also give you more specific instructions. Your treatment has been planned according to current medical practices, but problems sometimes occur. Call your health care provider if you have any problems or questions after your procedure.  °WHAT TO EXPECT AFTER THE PROCEDURE  °After the procedure, it is typical to experience:  °Sleepiness.  °Nausea and vomiting. °HOME CARE INSTRUCTIONS  °For the first 24 hours after general anesthesia:  °Have a responsible person with you.  °Do not drive a car. If you are alone, do not take public transportation.  °Do not drink alcohol.  °Do not take medicine that has not been prescribed by your health care provider.  °Do not sign important papers or make important decisions.  °You may resume a normal diet and activities as directed by your health care provider.  °Change bandages (dressings) as directed.  °If you have questions or problems that seem related to general anesthesia, call the hospital and ask for the anesthetist or anesthesiologist on call. °SEEK MEDICAL CARE IF:  °You have nausea and vomiting that continue the day after anesthesia.  °You develop a rash. °SEEK IMMEDIATE MEDICAL CARE IF:  °You have difficulty breathing.  °You have chest pain.  °You have any allergic problems. °Document Released: 01/16/2001 Document Revised: 06/12/2013 Document Reviewed: 04/25/2013  °ExitCare® Patient Information ©2014 [employer].  ° ° °Laceration Care, Adult  ° ° °A laceration is a cut that goes through all layers of the skin. The cut goes into the tissue beneath the skin.  °HOME CARE  °For stitches  (sutures) or staples:  °Keep the cut clean and dry.  °If you have a bandage (dressing), change it at least once a day. Change the bandage if it gets wet or dirty, or as told by your doctor.  °Wash the cut with soap and water 2 times a day. Rinse the cut with water. Pat it dry with a clean towel.  °Put a thin layer of medicated cream on the cut as told by your doctor.  °You may shower after the first 24 hours. Do not soak the cut in water until the stitches are removed.  °Only take medicines as told by your doctor.  °Have your stitches or staples removed as told by your doctor. °For skin adhesive strips:  °Keep the cut clean and dry.  °Do not get the strips wet. You may take a bath, but be careful to keep the cut dry.  °If the cut gets wet, pat it dry with a clean towel.  °The strips will fall off on their own. Do not remove the strips that are still stuck to the cut. °For wound glue:  °You may shower or take baths. Do not soak or scrub the cut. Do not swim. Avoid heavy sweating until the glue falls off on its own. After a shower or bath, pat the cut dry with a clean towel.  °Do not put medicine on your cut until the glue falls off.  °If you have a bandage, do not put tape over the glue.  °Avoid lots of sunlight or tanning lamps until   the glue falls off. Put sunscreen on the cut for the first year to reduce your scar.  °The glue will fall off on its own. Do not pick at the glue. °You may need a tetanus shot if:  °You cannot remember when you had your last tetanus shot.  °You have never had a tetanus shot. °If you need a tetanus shot and you choose not to have one, you may get tetanus. Sickness from tetanus can be serious.  °GET HELP RIGHT AWAY IF:  °Your pain does not get better with medicine.  °Your arm, hand, leg, or foot loses feeling (numbness) or changes color.  °Your cut is bleeding.  °Your joint feels weak, or you cannot use your joint.  °You have painful lumps on your body.  °Your cut is red, puffy (swollen),  or painful.  °You have a red line on the skin near the cut.  °You have yellowish-white fluid (pus) coming from the cut.  °You have a fever.  °You have a bad smell coming from the cut or bandage.  °Your cut breaks open before or after stitches are removed.  °You notice something coming out of the cut, such as wood or glass.  °You cannot move a finger or toe. °MAKE SURE YOU:  °Understand these instructions.  °Will watch your condition.  °Will get help right away if you are not doing well or get worse. °Document Released: 03/28/2008 Document Revised: 01/02/2012 Document Reviewed: 04/05/2011  °ExitCare® Patient Information ©2014 [employer].  ° ° °

## 2014-02-04 NOTE — Progress Notes (Signed)
Called Dr. August Saucerean to verify dressing change time and whether pt can shower.  Orders received and relayed to pt

## 2014-02-04 NOTE — H&P (Signed)
Amy Dunlap is an 63 y.o. female.   Chief Complaint: Right shoulder pain HPI: Amy Dunlap is a 63 year old patient who is now 2 months out right shoulder fracture fixation she had severe osteoporosis at the time of the injury and treatment. One of the screws is backing out it is impeding her rehabilitative progress. Radiographs do show that the fracture has reasonable healing she presents now for removal of the screw so she can progress with her rehabilitation  Past Medical History  Diagnosis Date  . Hypertension   . Arthritis   . Heart murmur     ?  [name]. Anxiety   . History of kidney stones     Past Surgical History  Procedure Laterality Date  . Knee arthroscopy      Hx: of right knee  . Colonoscopy      Hx: of  . Tonsillectomy    . Dilation and curettage of uterus    . Orif humerus fracture Right 12/03/2013    Procedure: OPEN REDUCTION INTERNAL FIXATION (ORIF) PROXIMAL HUMERUS FRACTURE;  Surgeon: Amy CopaGregory Scott Jasmia Angst, MD;  [facility];  Service: Orthopedics;  Laterality: Right;    Family History  Problem Relation Age of Onset  . Hypertension Mother   . Heart attack Mother   . Cancer - Other Sister    Social History:  reports that she quit smoking about 3 years ago. Her smoking use included Cigarettes. She has a 25 pack-year smoking history. She has never used smokeless tobacco. She reports that she drinks alcohol. She reports that she does not use illicit drugs.  Allergies:  Allergies  Allergen Reactions  . Hydrocodone Nausea And Vomiting  . Oxycodone Nausea And Vomiting    Medications Prior to Admission  Medication Sig Dispense Refill  . cholecalciferol (VITAMIN D) 1000 UNITS tablet Take 2,000 Units by mouth daily.      [name]. escitalopram (LEXAPRO) 5 MG tablet Take 5 mg by mouth daily as needed (anxiety).       [name]. ibuprofen (ADVIL,MOTRIN) 200 MG tablet Take 200 mg by mouth every 6 (six) hours as needed for moderate pain.       [name]. lisinopril-hydrochlorothiazide (PRINZIDE,ZESTORETIC)  20-12.5 MG per tablet Take 1 tablet by mouth daily.      [name]. sulfamethoxazole-trimethoprim (BACTRIM DS) 800-160 MG per tablet Take 1 tablet by mouth daily.        No results found for this or any previous visit (from the past 48 hour(s)). No results found.  ROS all systems reviewed are negative they relate to the right shoulder  Blood pressure 133/73, pulse 64, temperature 97.8 F (36.6 C), temperature source Oral, resp. rate 20, weight 173 lb (78.472 kg), SpO2 100.00%. Physical Exam  On exam the patient has painful range of motion of the right shoulder incision is well-healed some crepitus present below the skin a.c. joint nontender neck range of motion full chest fluoroscopy patient heart is regular rate and rhythm bowel sounds benign patient is otherwise well well well nourished acute distress alert and oriented normal mood normal scan no lymphadenopathy Assessment/Plan Impression is right shoulder somatic hardware which is backing up to 2 severe osteoporosis in the patient is now 2 months out from shoulder fracture fixation plan is for removal. Plan at this time initially is for removal of one screw as the second toe does not look like it requires removal because it is not backing out. Will assess stability stability intraoperatively all questions answered  Amy Dunlap 02/04/2014, 10:34  AM    

## 2014-02-04 NOTE — Transfer of Care (Signed)
Immediate Anesthesia Transfer of Care Note  Patient: Amy Dunlap  Procedure(s) Performed: Procedure(s) with comments: HARDWARE REMOVAL (Right) - REMOVAL OF HARDWARE RIGHT SHOULDER.  Patient Location: PACU  Anesthesia Type:GA combined with regional for post-op pain  Level of Consciousness: awake, alert  and oriented  Airway & Oxygen Therapy: Patient Spontanous Breathing and Patient connected to face mask oxygen  Post-op Assessment: Report given to PACU RN, Post -op Vital signs reviewed and stable and Patient moving all extremities X 4  Post vital signs: Reviewed and stable  Complications: No apparent anesthesia complications

## 2014-02-04 NOTE — Anesthesia Procedure Notes (Addendum)
Anesthesia Regional Block:  Interscalene brachial plexus block  Pre-Anesthetic Checklist: ,, timeout performed, Correct Patient, Correct Site, Correct Laterality, Correct Procedure, Correct Position, site marked, Risks and benefits discussed,  Surgical consent,  Pre-op evaluation,  At surgeon's request and post-op pain management  Laterality: Right  Prep: chloraprep       Needles:  Injection technique: Single-shot  Needle Type: Echogenic Stimulator Needle     Needle Length: 5cm 5 cm Needle Gauge: 21 and 21 G    Additional Needles:  Procedures: ultrasound guided (picture in chart) and nerve stimulator Interscalene brachial plexus block  Nerve Stimulator or Paresthesia:  Response: 0.4 mA,   Additional Responses:   Narrative:  Start time: 02/04/2014 10:10 AM End time: 02/04/2014 10:25 AM Injection made incrementally with aspirations every 5 mL.  Performed by: Personally  Anesthesiologist: Arta BruceKevin Ossey MD  Additional Notes: Monitors applied. Patient sedated. Sterile prep and drape,hand hygiene and sterile gloves were used. Relevant anatomy identified.Needle position confirmed.Local anesthetic injected incrementally after negative aspiration. Local anesthetic spread visualized around nerve(s). Vascular puncture avoided. No complications. Image printed for medical record.The patient tolerated the procedure well.        Procedure Name: Intubation Date/Time: 02/04/2014 10:43 AM Performed by: Elon AlasLEE, Dayle Sherpa BROWN Pre-anesthesia Checklist: Patient identified, Timeout performed, Emergency Drugs available, Suction available and Patient being monitored Patient Re-evaluated:Patient Re-evaluated prior to inductionOxygen Delivery Method: Circle system utilized Preoxygenation: Pre-oxygenation with 100% oxygen Intubation Type: IV induction Ventilation: Mask ventilation without difficulty Laryngoscope Size: Mac and 3 Grade View: Grade I Tube type: Oral Tube size: 7.0 mm Number of  attempts: 1 Airway Equipment and Method: Stylet Placement Confirmation: CO2 detector,  positive ETCO2,  ETT inserted through vocal cords under direct vision and breath sounds checked- equal and bilateral Secured at: 21 cm Tube secured with: Tape Dental Injury: Teeth and Oropharynx as per pre-operative assessment

## 2014-02-04 NOTE — Anesthesia Preprocedure Evaluation (Addendum)
Anesthesia Evaluation  Patient identified by MRN, date of birth, ID band Patient awake    Reviewed: Allergy & Precautions, H&P , NPO status , Patient's Chart, lab work & pertinent test results  Airway Mallampati: I TM Distance: >3 FB Neck ROM: Full    Dental  (+) Dental Advisory Given   Pulmonary former smoker,          Cardiovascular hypertension, Pt. on medications     Neuro/Psych PSYCHIATRIC DISORDERS Anxiety    GI/Hepatic   Endo/Other    Renal/GU      Musculoskeletal  (+) Arthritis -,   Abdominal   Peds  Hematology   Anesthesia Other Findings   Reproductive/Obstetrics                          Anesthesia Physical Anesthesia Plan  ASA: II  Anesthesia Plan: General   Post-op Pain Management:    Induction: Intravenous  Airway Management Planned: Oral ETT  Additional Equipment:   Intra-op Plan:   Post-operative Plan: Extubation in OR  Informed Consent: I have reviewed the patients History and Physical, chart, labs and discussed the procedure including the risks, benefits and alternatives for the proposed anesthesia with the patient or authorized representative who has indicated his/her understanding and acceptance.   Dental advisory given  Plan Discussed with: Surgeon and CRNA  Anesthesia Plan Comments:        Anesthesia Quick Evaluation

## 2014-02-05 ENCOUNTER — Encounter (HOSPITAL_COMMUNITY): Payer: Self-pay | Admitting: Orthopedic Surgery

## 2014-02-05 NOTE — Op Note (Signed)
Amy Dunlap:  [name], [name]                  ACCOUNT NO.:  [id number]  MEDICAL RECORD NO.:  [id number]  LOCATION:  [facility]                         FACILITY:  [facility]  PHYSICIAN:  Burnard BuntingG. Scott Dean, M.D.    DATE OF BIRTH:  1951/01/27  DATE OF PROCEDURE: DATE OF DISCHARGE:  02/04/2014                              OPERATIVE REPORT   PREOPERATIVE DIAGNOSIS:  Retained hardware, right shoulder.  POSTOPERATIVE DIAGNOSIS:  Retained hardware, right shoulder.  PROCEDURE:  Removal of hardware, right shoulder screws x2, washer x1.  SURGEON:  Burnard BuntingG. Scott Dean, M.D.  ASSISTANT:  None.  ANESTHESIA:  General.  INDICATIONS:  [name] is a patient with 2 months out from right shoulder fracture fixation.  The fracture appears healed on x-ray, however, because of her osteoporotic bone, screws are backing up.  She presents now for operative management after explanation of risks, benefits for hardware removal.  PROCEDURE IN DETAIL:  The patient was brought to the operating room where general endotracheal anesthesia was induced.  Preop antibiotics were administered.  A time-out was called.  The patient was placed in a beach-chair position with the head in neutral position.  Right shoulder was prescrubbed with alcohol and Betadine which was allowed to air dry. Prepped with DuraPrep solution and draped in sterile manner including the hand.  [name] was used to cover the operative field.  Time-out was called.  Incision made to the prior deltoid split.  Skin and subcutaneous tissue were sharply divided.  Deltoid split measured distance of 3 cm from the anterolateral margin of the acromion. Bursectomy was performed.  The superior screw was palpable and was rubbing the muscle underneath.  This was removed.  Surrounding bone was stable.  The second screw was then palpated and backed up about 4 mm from its initial position.  It was also removed and the washer was removed.  Under fluoroscopic examination, all hardware removed  and the fracture was stable.  Incision thoroughly irrigated.  Deltoid split was closed using 0 Vicryl suture followed by interrupted inverted 2-0 Vicryl suture and 3-0 Prolene.  Bulky dressing applied.  Sling applied.     Burnard BuntingG. Scott Dean, M.D.    GSD/MEDQ  D:  02/04/2014  T:  02/05/2014  Job:  [id number]

## 2016-09-27 ENCOUNTER — Other Ambulatory Visit: Payer: Self-pay | Admitting: Nurse Practitioner

## 2016-09-27 ENCOUNTER — Other Ambulatory Visit (HOSPITAL_COMMUNITY): Payer: Self-pay | Admitting: Nurse Practitioner

## 2016-09-27 DIAGNOSIS — B182 Chronic viral hepatitis C: Secondary | ICD-10-CM

## 2016-10-19 ENCOUNTER — Ambulatory Visit (HOSPITAL_COMMUNITY)
Admission: RE | Admit: 2016-10-19 | Discharge: 2016-10-19 | Disposition: A | Discharge status: Home / Self Care | Payer: Medicare Other | Source: Ambulatory Visit | Attending: Nurse Practitioner | Admitting: Nurse Practitioner

## 2016-10-19 ENCOUNTER — Ambulatory Visit (HOSPITAL_COMMUNITY): Admission: RE | Admit: 2016-10-19 | Payer: Medicare Other | Source: Ambulatory Visit

## 2016-10-19 DIAGNOSIS — B182 Chronic viral hepatitis C: Secondary | ICD-10-CM | POA: Diagnosis not present

## 2017-03-22 ENCOUNTER — Other Ambulatory Visit: Payer: Self-pay | Admitting: Nurse Practitioner

## 2017-03-22 DIAGNOSIS — K7400 Hepatic fibrosis, unspecified: Secondary | ICD-10-CM

## 2017-03-29 ENCOUNTER — Ambulatory Visit
Admission: RE | Admit: 2017-03-29 | Discharge: 2017-03-29 | Disposition: A | Discharge status: Home / Self Care | Payer: Medicare Other | Source: Ambulatory Visit | Attending: Nurse Practitioner | Admitting: Nurse Practitioner

## 2017-03-29 DIAGNOSIS — K7400 Hepatic fibrosis, unspecified: Secondary | ICD-10-CM

## 2017-10-10 ENCOUNTER — Other Ambulatory Visit: Payer: Self-pay | Admitting: Nurse Practitioner

## 2017-10-10 DIAGNOSIS — K7400 Hepatic fibrosis, unspecified: Secondary | ICD-10-CM

## 2017-11-07 ENCOUNTER — Other Ambulatory Visit: Payer: Medicare Other

## 2017-11-28 ENCOUNTER — Ambulatory Visit
Admission: RE | Admit: 2017-11-28 | Discharge: 2017-11-28 | Disposition: A | Discharge status: Home / Self Care | Payer: Medicare Other | Source: Ambulatory Visit | Attending: Nurse Practitioner | Admitting: Nurse Practitioner

## 2017-11-28 DIAGNOSIS — K7400 Hepatic fibrosis, unspecified: Secondary | ICD-10-CM

## 2018-06-14 ENCOUNTER — Other Ambulatory Visit: Payer: Self-pay | Admitting: Nurse Practitioner

## 2018-06-14 DIAGNOSIS — K7400 Hepatic fibrosis, unspecified: Secondary | ICD-10-CM

## 2018-06-21 ENCOUNTER — Ambulatory Visit
Admission: RE | Admit: 2018-06-21 | Discharge: 2018-06-21 | Disposition: A | Discharge status: Home / Self Care | Payer: Medicare Other | Source: Ambulatory Visit | Attending: Nurse Practitioner | Admitting: Nurse Practitioner

## 2018-06-21 DIAGNOSIS — K7400 Hepatic fibrosis, unspecified: Secondary | ICD-10-CM

## 2018-11-29 ENCOUNTER — Other Ambulatory Visit: Payer: Self-pay | Admitting: Nurse Practitioner

## 2018-11-29 DIAGNOSIS — K7400 Hepatic fibrosis, unspecified: Secondary | ICD-10-CM

## 2018-12-06 ENCOUNTER — Ambulatory Visit
Admission: RE | Admit: 2018-12-06 | Discharge: 2018-12-06 | Disposition: A | Discharge status: Home / Self Care | Payer: Medicare Other | Source: Ambulatory Visit | Attending: Nurse Practitioner | Admitting: Nurse Practitioner

## 2018-12-06 DIAGNOSIS — K7400 Hepatic fibrosis, unspecified: Secondary | ICD-10-CM

## 2019-04-19 ENCOUNTER — Encounter: Payer: Self-pay | Admitting: Radiology

## 2019-06-13 ENCOUNTER — Other Ambulatory Visit: Payer: Self-pay | Admitting: Nurse Practitioner

## 2019-06-13 DIAGNOSIS — K7469 Other cirrhosis of liver: Secondary | ICD-10-CM

## 2019-06-21 ENCOUNTER — Ambulatory Visit
Admission: RE | Admit: 2019-06-21 | Discharge: 2019-06-21 | Disposition: A | Discharge status: Home / Self Care | Payer: Medicare Other | Source: Ambulatory Visit | Attending: Nurse Practitioner | Admitting: Nurse Practitioner

## 2019-06-21 DIAGNOSIS — K7469 Other cirrhosis of liver: Secondary | ICD-10-CM

## 2019-11-10 NOTE — Progress Notes (Signed)
Cardiology Office Note:    Date:  11/11/2019   ID:  Amy, Dunlap 1951/01/15, MRN [id number]  PCP:  [name], CNM  Cardiologist:  No primary care provider on file.  Electrophysiologist:  None   Referring MD: [name], CNM   Chief Complaint  Patient presents with  . Bradycardia    History of Present Illness:    Amy Dunlap is a 69 y.o. female with a hx of bradycardia.  We were asked to see her today for further evaluation of her bradcardia by Dr [name].   She has some dizziness on occasion.  Usually orthostatic.  No dizziness with sitting   No weakness, no weight gain.  No N/V/D Not on any supplements not listed in the meds list   No CP ,  Mild DOE with significant exertion ( walking her dog too quickly )  Hx of anxiety ,  Has had to go back to work.   Had Hep C,  Still goes to the hepatologist.    Past Medical History:  Diagnosis Date  . Anxiety   . Arthritis   . Heart murmur    ?  [name] History of kidney stones   . Hypertension     Past Surgical History:  Procedure Laterality Date  . COLONOSCOPY     Hx: of  . DILATION AND CURETTAGE OF UTERUS    . HARDWARE REMOVAL Right 02/04/2014   Procedure: HARDWARE REMOVAL;  Surgeon: [name], MD;  Location: [place];  Service: Orthopedics;  Laterality: Right;  REMOVAL OF HARDWARE RIGHT SHOULDER.  [name] KNEE ARTHROSCOPY     Hx: of right knee  . ORIF HUMERUS FRACTURE Right 12/03/2013   Procedure: OPEN REDUCTION INTERNAL FIXATION (ORIF) PROXIMAL HUMERUS FRACTURE;  Surgeon: [name], MD;  Location: [place];  Service: Orthopedics;  Laterality: Right;  . TONSILLECTOMY      Current Medications: Current Meds  Medication Sig  . Cetirizine HCl (ZYRTEC ALLERGY) 10 MG CAPS as needed.  . cholecalciferol (VITAMIN D) 1000 UNITS tablet Take 2,000 Units by mouth daily.  [name] escitalopram (LEXAPRO) 5 MG tablet Take 5 mg by mouth daily as needed (anxiety).   [name] ibuprofen (ADVIL,MOTRIN) 200 MG tablet Take 200 mg by  mouth every 6 (six) hours as needed for moderate pain.   [name] lisinopril-hydrochlorothiazide (PRINZIDE,ZESTORETIC) 20-12.5 MG per tablet Take 1 tablet by mouth daily.  . ondansetron (ZOFRAN) 4 MG tablet Take 1 tablet (4 mg total) by mouth every 8 (eight) hours as needed for nausea or vomiting.  . traMADol (ULTRAM) 50 MG tablet 50 mg as needed.   . traZODone (DESYREL) 50 MG tablet TAKE 1 TABLET AT BEDTIME AS NEEDED ORALLY ONCE A DAY 90 DAYS  . vitamin B-12 (CYANOCOBALAMIN) 1000 MCG tablet Take 1,000 mcg by mouth daily.     Allergies:   Hydrocodone and Oxycodone   Social History   Socioeconomic History  . Marital status: Widowed    Spouse name: Not on file  . Number of children: Not on file  . Years of education: Not on file  . Highest education level: Not on file  Occupational History  . Not on file  Tobacco Use  . Smoking status: Former Smoker    Packs/day: 1.00    Years: 25.00    Pack years: 25.00    Types: Cigarettes    Quit date: 01/25/2011    Years since quitting: 8.8  . Smokeless tobacco: Never Used  . Tobacco  comment: Quit smoking cigarettes in 2014  Substance and Sexual Activity  . Alcohol use: Yes    Comment: 1 beer a week  . Drug use: No  . Sexual activity: Not on file  Other Topics Concern  . Not on file  Social History Narrative  . Not on file   Social Determinants of Health   Financial Resource Strain:   . Difficulty of Paying Living Expenses: Not on file  Food Insecurity:   . Worried About Programme researcher, broadcasting/film/video in the Last Year: Not on file  . Ran Out of Food in the Last Year: Not on file  Transportation Needs:   . Lack of Transportation (Medical): Not on file  . Lack of Transportation (Non-Medical): Not on file  Physical Activity:   . Days of Exercise per Week: Not on file  . Minutes of Exercise per Session: Not on file  Stress:   . Feeling of Stress : Not on file  Social Connections:   . Frequency of Communication with Friends and Family: Not on file    . Frequency of Social Gatherings with Friends and Family: Not on file  . Attends Religious Services: Not on file  . Active Member of Clubs or Organizations: Not on file  . Attends Banker Meetings: Not on file  . Marital Status: Not on file     Family History: The patient's family history includes Cancer - Other in her sister; Heart attack in her mother; Hypertension in her mother.  ROS:   Please see the history of present illness.     All other systems reviewed and are negative.  EKGs/Labs/Other Studies Reviewed:    The following studies were reviewed today:   EKG:  Jan. 19, 2021:   Sinus brady at 49.  With a 1 min step test.  Her HR increased up to 95.   Recent Labs: No results found for requested labs within last 8760 hours.  Recent Lipid Panel No results found for: CHOL, TRIG, HDL, CHOLHDL, VLDL, LDLCALC, LDLDIRECT  Physical Exam:    VS:  BP 140/70   Pulse (!) 49   Ht 5\' 6"  (1.676 m)   Wt 161 lb 6.4 oz (73.2 kg)   SpO2 98%   BMI 26.05 kg/m     Wt Readings from Last 3 Encounters:  11/11/19 161 lb 6.4 oz (73.2 kg)  02/04/14 173 lb (78.5 kg)  01/24/14 173 lb 3.2 oz (78.6 kg)     GEN:  Well nourished, well developed in no acute distress HEENT: Normal NECK: No JVD; No carotid bruits LYMPHATICS: No lymphadenopathy CARDIAC: RRR, no murmurs, rubs, gallops RESPIRATORY:  Clear to auscultation without rales, wheezing or rhonchi  ABDOMEN: Soft, non-tender, non-distended MUSCULOSKELETAL:  No edema; No deformity  SKIN: Warm and dry NEUROLOGIC:  Alert and oriented x 3 PSYCHIATRIC:  Normal affect   ASSESSMENT:    1. Bradycardia    PLAN:    In order of problems listed above:  1. Sinus bradycardia: Triana presents with benign sinus bradycardia.  She is not had any symptoms of syncope or presyncope.  With 1 minute step test her heart rate increased to 95.  She has intact chronotropic response.  At this point I do not think that she needs any further  testing.  We will continue to see her on a yearly basis for EKGs.  She will call [name] if she has any syncope or presyncope.   Medication Adjustments/Labs and Tests Ordered: Current medicines are  reviewed at length with the patient today.  Concerns regarding medicines are outlined above.  Orders Placed This Encounter  Procedures  . TSH  . EKG 12-Lead   No orders of the defined types were placed in this encounter.   Patient Instructions  Medication Instructions:  Your physician recommends that you continue on your current medications as directed. Please refer to the Current Medication list given to you today.  *If you need a refill on your cardiac medications before your next appointment, please call your pharmacy*  Lab Work: TODAY - TSH If you have labs (blood work) drawn today and your tests are completely normal, you will receive your results only by: [name] MyChart Message (if you have MyChart) OR . A paper copy in the mail If you have any lab test that is abnormal or we need to change your treatment, we will call you to review the results.   Testing/Procedures: None Ordered   Follow-Up: At [facility], you and your health needs are our priority.  As part of our continuing mission to provide you with exceptional heart care, we have created designated Provider Care Teams.  These Care Teams include your primary Cardiologist (physician) and Advanced Practice Providers (APPs -  Physician Assistants and Nurse Practitioners) who all work together to provide you with the care you need, when you need it.  Your next appointment:   1 year(s)  The format for your next appointment:   In Person  Provider:   You may see Kristeen Miss, MD or one of the following Advanced Practice Providers on your designated Care Team:    Tereso Newcomer, PA-C  Vin Moyock, [place]  Berton Bon, [place]       Signed, Kristeen Miss, MD  11/11/2019 1:57 PM    [facility]

## 2019-11-11 ENCOUNTER — Encounter: Payer: Self-pay | Admitting: Cardiovascular Disease

## 2019-11-11 ENCOUNTER — Other Ambulatory Visit: Payer: Self-pay

## 2019-11-11 ENCOUNTER — Ambulatory Visit (INDEPENDENT_AMBULATORY_CARE_PROVIDER_SITE_OTHER): Payer: Medicare Other | Admitting: Cardiovascular Disease

## 2019-11-11 VITALS — BP 140/70 | HR 49 | Ht 66.0 in | Wt 161.4 lb

## 2019-11-11 DIAGNOSIS — R001 Bradycardia, unspecified: Secondary | ICD-10-CM

## 2019-11-11 NOTE — Patient Instructions (Signed)
Medication Instructions:  Your physician recommends that you continue on your current medications as directed. Please refer to the Current Medication list given to you today.  *If you need a refill on your cardiac medications before your next appointment, please call your pharmacy*   Lab Work: TODAY  - TSH If you have labs (blood work) drawn today and your tests are completely normal, you will receive your results only by: . MyChart Message (if you have MyChart) OR . A paper copy in the mail If you have any lab test that is abnormal or we need to change your treatment, we will call you to review the results.   Testing/Procedures: None Ordered   Follow-Up: At [facility], you and your health needs are our priority.  As part of our continuing mission to provide you with exceptional heart care, we have created designated Provider Care Teams.  These Care Teams include your primary Cardiologist (physician) and Advanced Practice Providers (APPs -  Physician Assistants and Nurse Practitioners) who all work together to provide you with the care you need, when you need it.   Your next appointment:   1 year(s)  The format for your next appointment:   In Person  Provider:   You may see Philip Nahser, MD or one of the following Advanced Practice Providers on your designated Care Team:    Scott Weaver, PA-C  Vin Bhagat, PA-C  Janine Hammond, NP      

## 2019-11-12 LAB — TSH: TSH: 3.16 u[IU]/mL (ref 0.450–4.500)

## 2019-12-06 ENCOUNTER — Other Ambulatory Visit: Payer: Self-pay | Admitting: Nurse Practitioner

## 2019-12-06 DIAGNOSIS — K7469 Other cirrhosis of liver: Secondary | ICD-10-CM

## 2020-01-08 ENCOUNTER — Ambulatory Visit
Admission: RE | Admit: 2020-01-08 | Discharge: 2020-01-08 | Disposition: A | Discharge status: Home / Self Care | Payer: Medicare Other | Source: Ambulatory Visit | Attending: Nurse Practitioner | Admitting: Nurse Practitioner

## 2020-01-08 DIAGNOSIS — K7469 Other cirrhosis of liver: Secondary | ICD-10-CM

## 2020-07-27 ENCOUNTER — Other Ambulatory Visit: Payer: Self-pay | Admitting: Nurse Practitioner

## 2020-07-27 DIAGNOSIS — K7402 Hepatic fibrosis, advanced fibrosis: Secondary | ICD-10-CM

## 2020-08-15 IMAGING — US US ABDOMEN LIMITED
1 series · 14 of 25 positions shown · non-contrast
Comparison: 11/28/2017 abdominal ultrasound

CLINICAL DATA: Liver fibrosis.

EXAM:
ULTRASOUND ABDOMEN LIMITED RIGHT UPPER QUADRANT

[Series 1: us abdomen limited · 0.21mm/px · 14 of 39 slices shown]
[im 1/39]
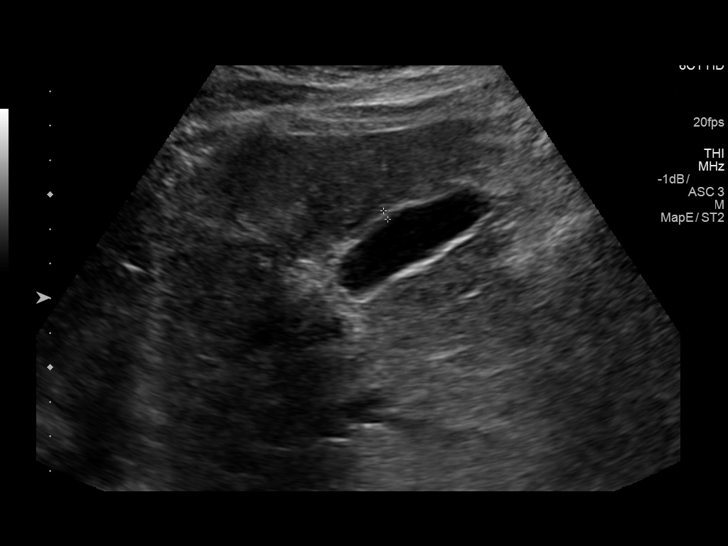
[im 4/39]
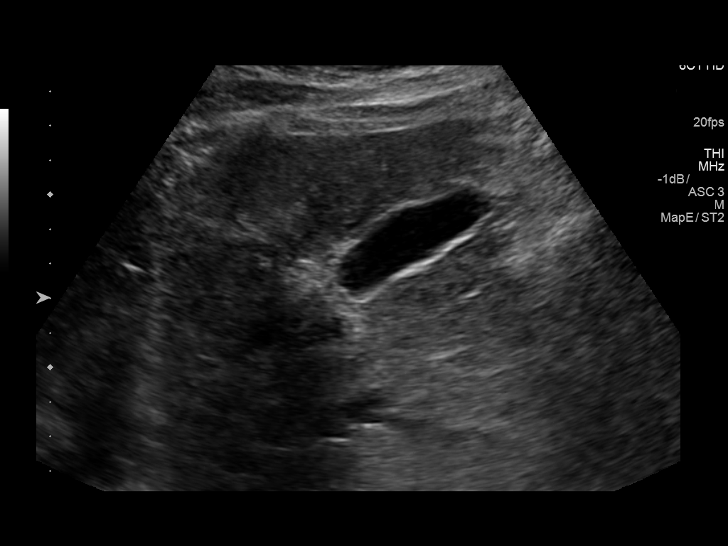
[im 7/39]
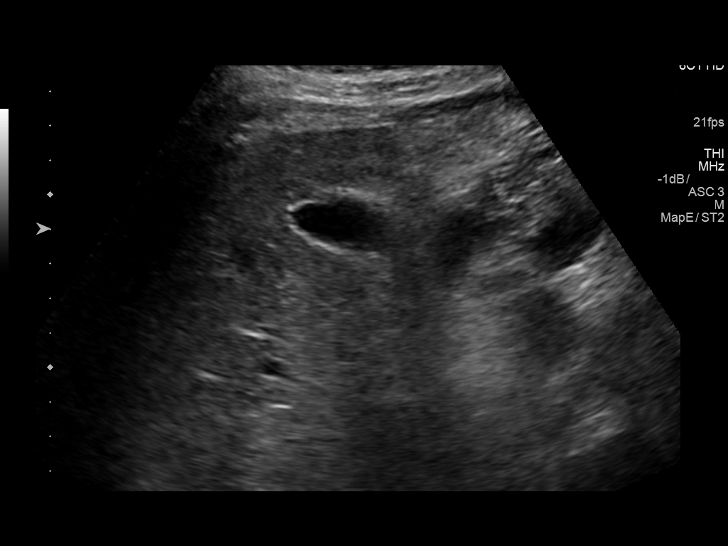
[im 10/39]
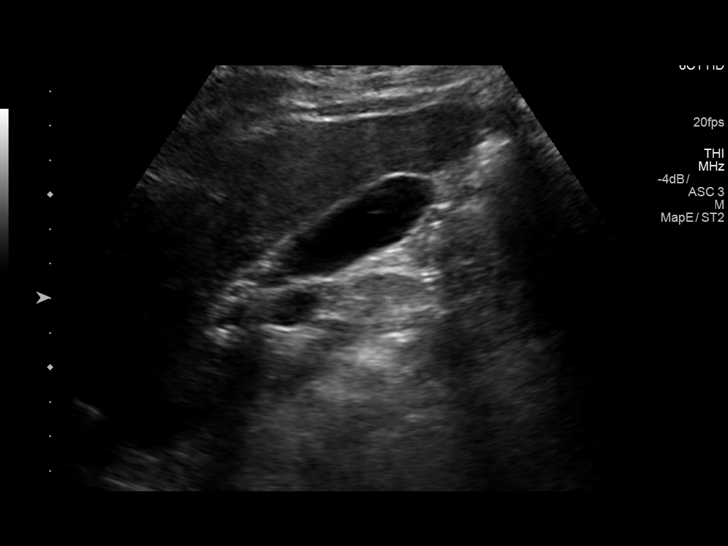
[im 13/39]
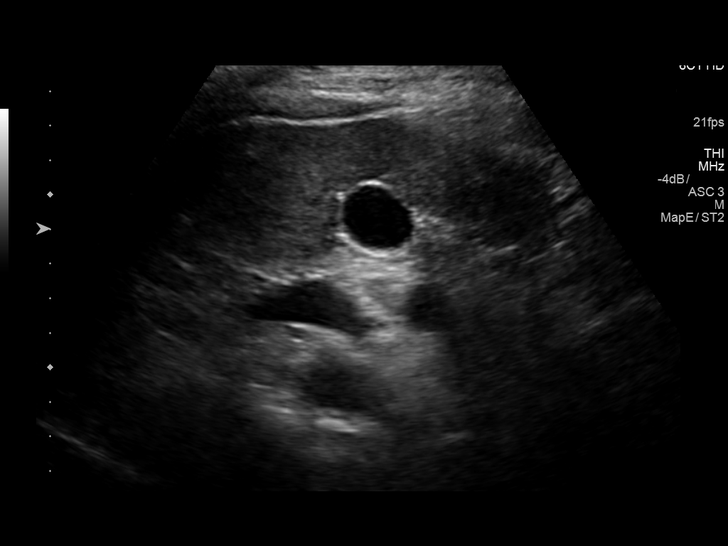
[im 15/39]
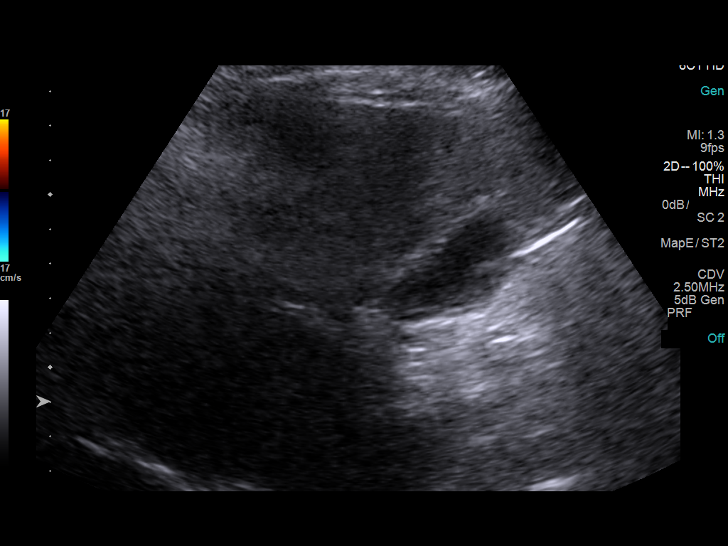
[im 18/39]
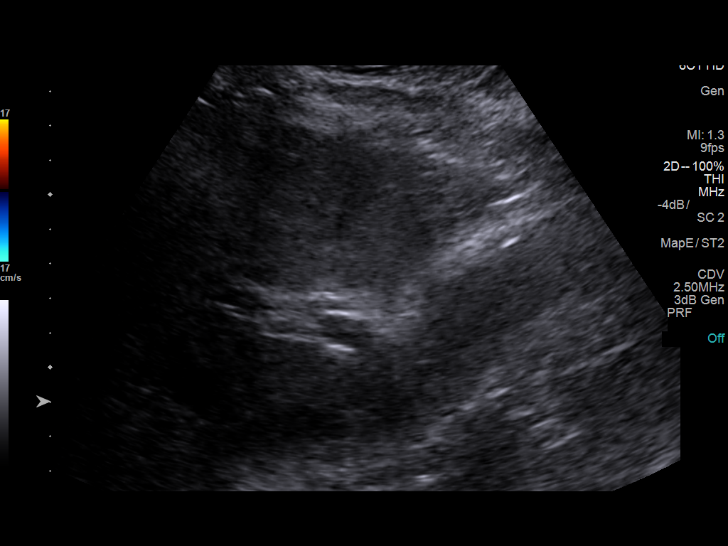
[im 21/39]
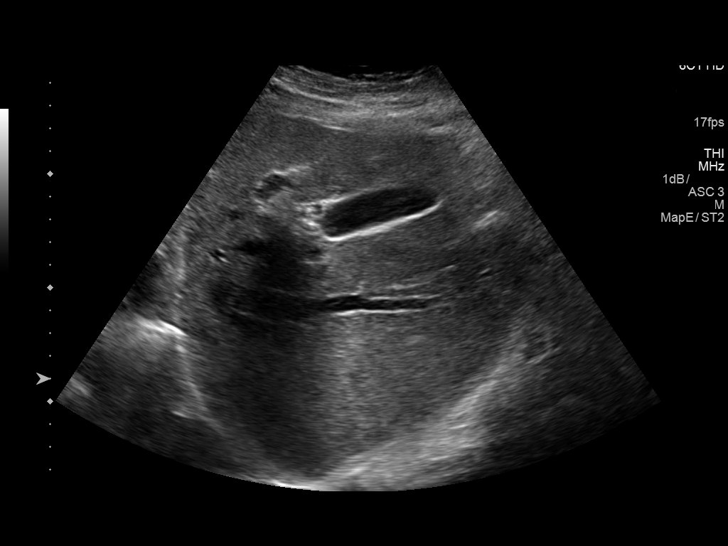
[im 24/39]
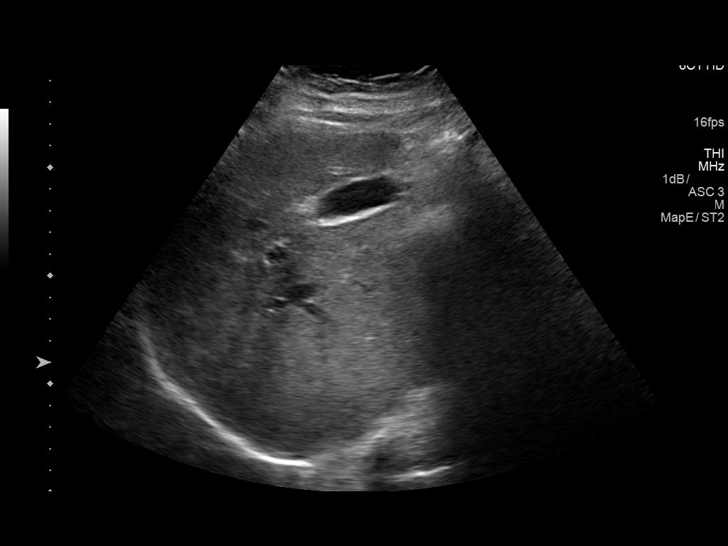
[im 26/39]
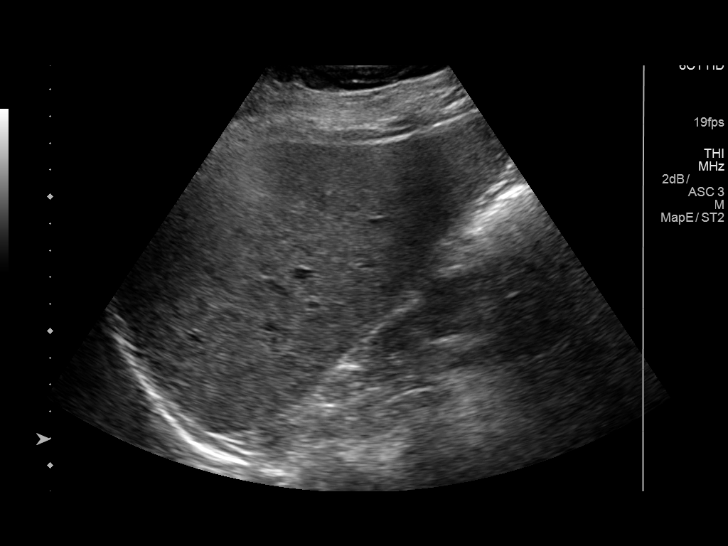
[im 29/39]
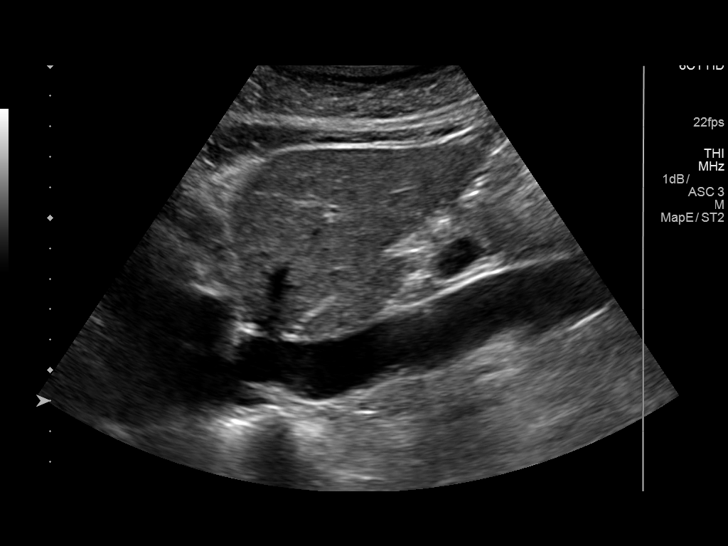
[im 32/39]
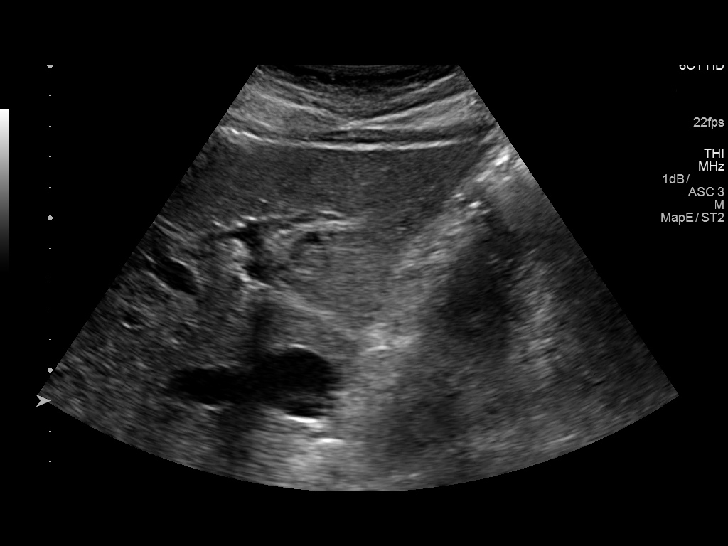
[im 35/39]
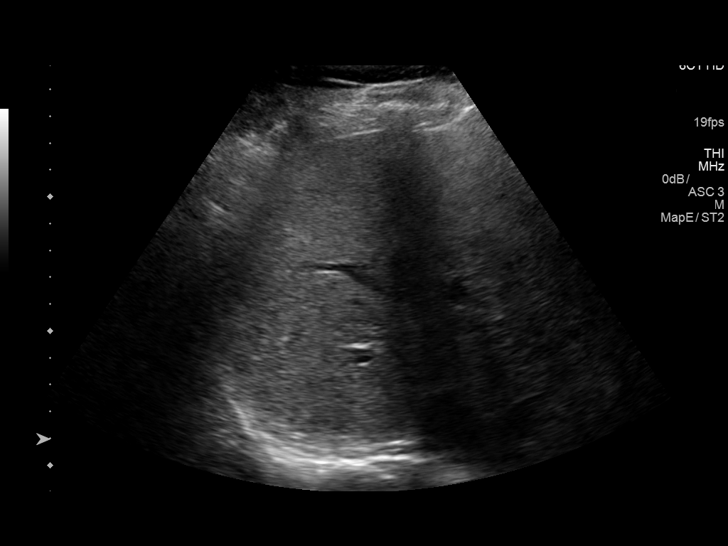
[im 39/39]
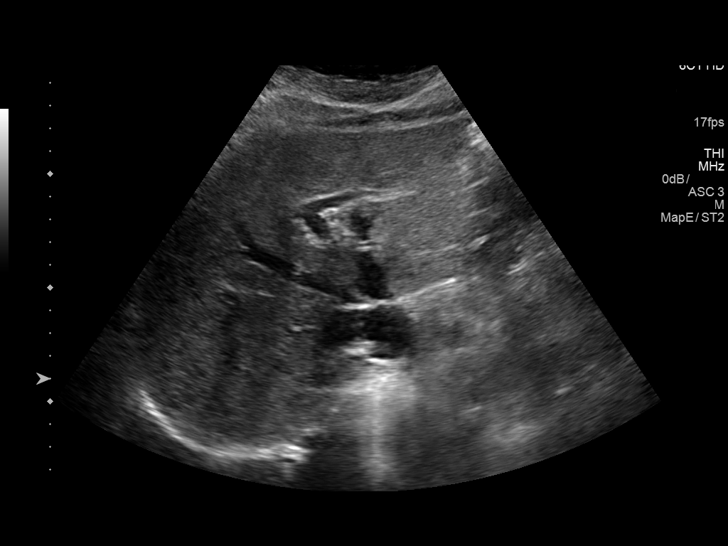

[14 of 25 positions shown; findings below may reference images not displayed]

FINDINGS: Gallbladder:

No gallstones or wall thickening visualized. No sonographic Murphy
sign noted by sonographer.

Common bile duct:

Diameter: 4 mm

Liver:

Diffusely heterogeneous parenchymal echotexture, similar to prior.
No focal lesion identified. Portal vein is patent on color Doppler
imaging with normal direction of blood flow towards the liver.
IMPRESSION: Chronic diffuse liver heterogeneity consistent with the history of
fibrosis. No focal lesion identified.

## 2020-08-21 ENCOUNTER — Other Ambulatory Visit: Payer: Self-pay | Admitting: Family Medicine

## 2020-08-21 DIAGNOSIS — I739 Peripheral vascular disease, unspecified: Secondary | ICD-10-CM

## 2020-08-24 ENCOUNTER — Ambulatory Visit
Admission: RE | Admit: 2020-08-24 | Discharge: 2020-08-24 | Disposition: A | Discharge status: Home / Self Care | Payer: Medicare Other | Source: Ambulatory Visit | Attending: Nurse Practitioner | Admitting: Nurse Practitioner

## 2020-08-24 DIAGNOSIS — K7402 Hepatic fibrosis, advanced fibrosis: Secondary | ICD-10-CM

## 2020-09-01 ENCOUNTER — Other Ambulatory Visit: Payer: Medicare Other

## 2020-09-16 ENCOUNTER — Other Ambulatory Visit: Payer: Medicare Other

## 2020-10-05 ENCOUNTER — Ambulatory Visit
Admission: RE | Admit: 2020-10-05 | Discharge: 2020-10-05 | Disposition: A | Discharge status: Home / Self Care | Payer: Medicare Other | Source: Ambulatory Visit | Attending: Family Medicine | Admitting: Family Medicine

## 2020-10-05 DIAGNOSIS — I739 Peripheral vascular disease, unspecified: Secondary | ICD-10-CM

## 2021-01-28 ENCOUNTER — Other Ambulatory Visit: Payer: Self-pay | Admitting: Nurse Practitioner

## 2021-01-28 DIAGNOSIS — K7402 Hepatic fibrosis, advanced fibrosis: Secondary | ICD-10-CM

## 2021-02-02 ENCOUNTER — Other Ambulatory Visit: Payer: Self-pay | Admitting: Family Medicine

## 2021-02-02 DIAGNOSIS — E2839 Other primary ovarian failure: Secondary | ICD-10-CM

## 2021-02-02 DIAGNOSIS — M8580 Other specified disorders of bone density and structure, unspecified site: Secondary | ICD-10-CM

## 2021-02-22 ENCOUNTER — Ambulatory Visit
Admission: RE | Admit: 2021-02-22 | Discharge: 2021-02-22 | Disposition: A | Discharge status: Home / Self Care | Payer: Medicare Other | Source: Ambulatory Visit | Attending: Nurse Practitioner | Admitting: Nurse Practitioner

## 2021-02-22 DIAGNOSIS — K7402 Hepatic fibrosis, advanced fibrosis: Secondary | ICD-10-CM

## 2021-03-25 ENCOUNTER — Encounter: Payer: Self-pay | Admitting: Orthopedic Surgery

## 2021-03-25 ENCOUNTER — Ambulatory Visit: Payer: Self-pay

## 2021-03-25 ENCOUNTER — Other Ambulatory Visit: Payer: Self-pay

## 2021-03-25 ENCOUNTER — Ambulatory Visit (INDEPENDENT_AMBULATORY_CARE_PROVIDER_SITE_OTHER): Payer: Medicare Other | Admitting: Physician Assistant

## 2021-03-25 VITALS — Ht 66.0 in | Wt 161.0 lb

## 2021-03-25 DIAGNOSIS — G8929 Other chronic pain: Secondary | ICD-10-CM | POA: Diagnosis not present

## 2021-03-25 DIAGNOSIS — M25562 Pain in left knee: Secondary | ICD-10-CM

## 2021-03-25 DIAGNOSIS — M25561 Pain in right knee: Secondary | ICD-10-CM

## 2021-03-25 MED ORDER — METHYLPREDNISOLONE ACETATE 40 MG/ML IJ SUSP
40.0000 mg | INTRAMUSCULAR | Status: AC | PRN
  Administered 2021-03-25: 40 mg via INTRA_ARTICULAR

## 2021-03-25 MED ORDER — LIDOCAINE HCL 1 % IJ SOLN
5.0000 mL | INTRAMUSCULAR | Status: AC | PRN
  Administered 2021-03-25: 5 mL

## 2021-03-25 NOTE — Progress Notes (Signed)
Office Visit Note   Patient: Amy Dunlap           Date of Birth: 09-21-1951           MRN: [id number] Visit Date: 03/25/2021              Requested by: Lupita Raider, MD [address] [place],  [place] [address] PCP: Lupita Raider, MD  Chief Complaint  Patient presents with  . Right Knee - Pain  . Left Knee - Pain      HPI: Patient is a pleasant 70 year old woman with a history of bilateral knee pain.  She states this may be secondary to getting closer to [place].  She has tried topical Voltaren and over-the-counter anti-inflammatories.  She has a remote history of a right knee arthroscopy .  Also demonstrated quadricep strengthening exercises Assessment & Plan: Visit Diagnoses:  1. Chronic pain of both knees     Plan: We will try as a course of Mobic.  She will discontinue her Advil.  We will go forward with injections today  Follow-Up Instructions: No follow-ups on file.   Ortho Exam  Patient is alert, oriented, no adenopathy, well-dressed, normal affect, normal respiratory effort. Bilateral knees varus alignment.  She has well-healed arthroscopy portals on the right knee.  Both knees have grinding with range of motion.  Tender more over the medial joint line.  Good endpoint with stability  Imaging: XR Knee 1-2 Views Left  Result Date: 03/25/2021 2 views of her knee demonstrate some sclerotic cysts and joint space narrowing especially medial.  Varus alignment  XR Knee 1-2 Views Right  Result Date: 03/25/2021 2 views of her right knee demonstrate varus alignment with joint space narrowing in the medial compartment moderate degenerative changes  No images are attached to the encounter.  Labs: No results found for: HGBA1C, ESRSEDRATE, CRP, LABURIC, REPTSTATUS, GRAMSTAIN, CULT, LABORGA   No results found for: ALBUMIN, PREALBUMIN, CBC  No results found for: MG No results found for: VD25OH  No results found for: PREALBUMIN CBC EXTENDED Latest  Ref Rng & Units 01/24/2014 12/03/2013  WBC 4.0 - 10.5 K/uL 5.5 7.9  RBC 3.87 - 5.11 MIL/uL 3.91 3.98  HGB 12.0 - 15.0 g/dL 11.8(L) 12.2  HCT 36.0 - 46.0 % 34.7(L) 35.7(L)  PLT 150 - 400 K/uL 227 345     Body mass index is 25.99 kg/m.  Orders:  Orders Placed This Encounter  Procedures  . XR Knee 1-2 Views Right  . XR Knee 1-2 Views Left   No orders of the defined types were placed in this encounter.    Procedures: Large Joint Inj: bilateral knee on 03/25/2021 2:25 PM Indications: pain and diagnostic evaluation Details: 22 G 1.5 in needle, anteromedial approach  Arthrogram: No  Medications (Right): 5 mL lidocaine 1 %; 40 mg methylPREDNISolone acetate 40 MG/ML Medications (Left): 5 mL lidocaine 1 %; 40 mg methylPREDNISolone acetate 40 MG/ML Outcome: tolerated well, no immediate complications Procedure, treatment alternatives, risks and benefits explained, specific risks discussed. Consent was given by the patient. Immediately prior to procedure a time out was called to verify the correct patient, procedure, equipment, support staff and site/side marked as required. Patient was prepped and draped in the usual sterile fashion.      Clinical Data: No additional findings.  ROS:  All other systems negative, except as noted in the HPI. Review of Systems  Objective: Vital Signs: Ht 5\' 6"  (1.676 m)   Wt 161  lb (73 kg)   BMI 25.99 kg/m   Specialty Comments:  No specialty comments available.  PMFS History: There are no problems to display for this patient.  Past Medical History:  Diagnosis Date  . Anxiety   . Arthritis   . Heart murmur    ?  [name] History of kidney stones   . Hypertension     Family History  Problem Relation Age of Onset  . Hypertension Mother   . Heart attack Mother   . Cancer - Other Sister     Past Surgical History:  Procedure Laterality Date  . COLONOSCOPY     Hx: of  . DILATION AND CURETTAGE OF UTERUS    . HARDWARE REMOVAL Right 02/04/2014    Procedure: HARDWARE REMOVAL;  Surgeon: Cammy Copa, MD;  Location: [facility] OR;  Service: Orthopedics;  Laterality: Right;  REMOVAL OF HARDWARE RIGHT SHOULDER.  [name] KNEE ARTHROSCOPY     Hx: of right knee  . ORIF HUMERUS FRACTURE Right 12/03/2013   Procedure: OPEN REDUCTION INTERNAL FIXATION (ORIF) PROXIMAL HUMERUS FRACTURE;  Surgeon: Cammy Copa, MD;  Location[facility] OR;  Service: Orthopedics;  Laterality: Right;  . TONSILLECTOMY     Social History   Occupational History  . Not on file  Tobacco Use  . Smoking status: Former Smoker    Packs/day: 1.00    Years: 25.00    Pack years: 25.00    Types: Cigarettes    Quit date: 01/25/2011    Years since quitting: 10.1  . Smokeless tobacco: Never Used  . Tobacco comment: Quit smoking cigarettes in 2014  Substance and Sexual Activity  . Alcohol use: Yes    Comment: 1 beer a week  . Drug use: No  . Sexual activity: Not on file

## 2021-04-15 ENCOUNTER — Ambulatory Visit (INDEPENDENT_AMBULATORY_CARE_PROVIDER_SITE_OTHER): Payer: Medicare Other | Admitting: Physician Assistant

## 2021-04-15 ENCOUNTER — Other Ambulatory Visit: Payer: Self-pay

## 2021-04-15 ENCOUNTER — Encounter: Payer: Self-pay | Admitting: Orthopedic Surgery

## 2021-04-15 DIAGNOSIS — M1711 Unilateral primary osteoarthritis, right knee: Secondary | ICD-10-CM | POA: Diagnosis not present

## 2021-04-15 MED ORDER — MELOXICAM 15 MG PO TABS: 15.0000 mg | ORAL_TABLET | Freq: Every day | ORAL | 0 refills | Status: DC

## 2021-04-15 NOTE — Progress Notes (Signed)
Office Visit Note   Patient: Amy Dunlap           Date of Birth: [birth year]           MRN: [id number] Visit Date: 04/15/2021              Requested by: Lupita Raider, MD [address] [place],  [place] [address] PCP: Lupita Raider, MD  Chief Complaint  Patient presents with   Right Knee - Follow-up    S/p bilateral knee injections 03/25/21   Left Knee - Follow-up      HPI: Patient is a pleasant 70 year old woman who is 3 weeks status post bilateral steroid injections into her knees.  She did say she had some good relief.  It lasted a few weeks.  Now the pain is beginning to return.  She thinks it may have been more aggravated because she has been in the process of moving.  She takes Advil for pain  Assessment & Plan: Visit Diagnoses: No diagnosis found.  Plan: Will authorize for gel injections.  In the meantime I will call her in some meloxicam.  She knows not to take other anti-inflammatories with this medication  Follow-Up Instructions: No follow-ups on file.   Ortho Exam  Patient is alert, oriented, no adenopathy, well-dressed, normal affect, normal respiratory effort. Examination demonstrates bilateral knees no cellulitis no erythema she has some tenderness bilaterally over all 3 compartments.  She also has grinding with range of motion  Imaging: No results found. No images are attached to the encounter.  Labs: No results found for: HGBA1C, ESRSEDRATE, CRP, LABURIC, REPTSTATUS, GRAMSTAIN, CULT, LABORGA   No results found for: ALBUMIN, PREALBUMIN, CBC  No results found for: MG No results found for: VD25OH  No results found for: PREALBUMIN CBC EXTENDED Latest Ref Rng & Units 01/24/2014 12/03/2013  WBC 4.0 - 10.5 K/uL 5.5 7.9  RBC 3.87 - 5.11 MIL/uL 3.91 3.98  HGB 12.0 - 15.0 g/dL 11.8(L) 12.2  HCT 36.0 - 46.0 % 34.7(L) 35.7(L)  PLT 150 - 400 K/uL 227 345     There is no height or weight on file to calculate BMI.  Orders:  No orders of the  defined types were placed in this encounter.  No orders of the defined types were placed in this encounter.    Procedures: No procedures performed  Clinical Data: No additional findings.  ROS:  All other systems negative, except as noted in the HPI. Review of Systems  Objective: Vital Signs: There were no vitals taken for this visit.  Specialty Comments:  No specialty comments available.  PMFS History: There are no problems to display for this patient.  Past Medical History:  Diagnosis Date   Anxiety    Arthritis    Heart murmur    ?   History of kidney stones    Hypertension     Family History  Problem Relation Age of Onset   Hypertension Mother    Heart attack Mother    Cancer - Other Sister     Past Surgical History:  Procedure Laterality Date   COLONOSCOPY     Hx: of   DILATION AND CURETTAGE OF UTERUS     HARDWARE REMOVAL Right 02/04/2014   Procedure: HARDWARE REMOVAL;  Surgeon: Cammy Copa, MD;  Location: [facility] OR;  Service: Orthopedics;  Laterality: Right;  REMOVAL OF HARDWARE RIGHT SHOULDER.   KNEE ARTHROSCOPY     Hx: of right knee   ORIF  HUMERUS FRACTURE Right 12/03/2013   Procedure: OPEN REDUCTION INTERNAL FIXATION (ORIF) PROXIMAL HUMERUS FRACTURE;  Surgeon: Cammy Copa, MD;  Location: [facility] OR;  Service: Orthopedics;  Laterality: Right;   TONSILLECTOMY     Social History   Occupational History   Not on file  Tobacco Use   Smoking status: Former    Packs/day: 1.00    Years: 25.00    Pack years: 25.00    Types: Cigarettes    Quit date: 01/25/2011    Years since quitting: 10.2   Smokeless tobacco: Never   Tobacco comments:    Quit smoking cigarettes in 2014  Substance and Sexual Activity   Alcohol use: Yes    Comment: 1 beer a week   Drug use: No   Sexual activity: Not on file

## 2021-04-16 ENCOUNTER — Telehealth: Payer: Self-pay

## 2021-04-16 NOTE — Telephone Encounter (Signed)
-----   Message from Rodena Medin, [place] sent at 04/15/2021  1:34 PM EDT ----- Regarding: gel injection This is a patient with bilateral OA and needs prior auth for bilateral gel injections please

## 2021-04-16 NOTE — Telephone Encounter (Signed)
Noted.  Will submit.  

## 2021-05-07 ENCOUNTER — Telehealth: Payer: Self-pay

## 2021-05-07 NOTE — Telephone Encounter (Signed)
VOB submitted for Durolane, bilateral knee. Pending BV. 

## 2021-05-14 ENCOUNTER — Telehealth: Payer: Self-pay

## 2021-05-14 NOTE — Telephone Encounter (Signed)
Called and left a Vm for patient to CB to schedule for gel injection with Dr. Lajoyce Corners.  Approved for Durolane, bilateral knee. Buy & Bill Covered at 100% of the allowable. No Co-pay No PA required

## 2021-05-19 ENCOUNTER — Ambulatory Visit (INDEPENDENT_AMBULATORY_CARE_PROVIDER_SITE_OTHER): Payer: Medicare Other | Admitting: Family

## 2021-05-19 ENCOUNTER — Encounter: Payer: Self-pay | Admitting: Family

## 2021-05-19 DIAGNOSIS — M1711 Unilateral primary osteoarthritis, right knee: Secondary | ICD-10-CM

## 2021-05-19 DIAGNOSIS — M1712 Unilateral primary osteoarthritis, left knee: Secondary | ICD-10-CM

## 2021-05-19 MED ORDER — SODIUM HYALURONATE 60 MG/3ML IX PRSY
60.0000 mg | PREFILLED_SYRINGE | INTRA_ARTICULAR | Status: AC | PRN
  Administered 2021-05-19: 60 mg via INTRA_ARTICULAR

## 2021-05-19 MED ORDER — LIDOCAINE HCL 1 % IJ SOLN
1.0000 mL | INTRAMUSCULAR | Status: AC | PRN
  Administered 2021-05-19: 1 mL

## 2021-05-19 NOTE — Progress Notes (Signed)
Office Visit Note   Patient: Amy Dunlap           Date of Birth: 1950-11-09           MRN: [id number] Visit Date: 05/19/2021              Requested by: Lupita Raider, MD [address] [place],  [place] [address] PCP: Lupita Raider, MD  Chief Complaint  Patient presents with  . Right Knee - Follow-up    Bilateral knee buy and bill Durolane   . Left Knee - Follow-up      HPI: Patient is a pleasant 70 year old woman who is seen today for planned dural Lane injection bilateral knees.  Has a history of osteoarthritis bilateral knees was using anti-inflammatories with modest relief did have Depo-Medrol injections multiple times with less and less relief.   Assessment & Plan: Visit Diagnoses:  1. Primary osteoarthritis of left knee   2. Primary osteoarthritis of right knee     Plan: [place] injection bilateral knees today.  Patient tolerated well.  She will follow-up in the office as needed.  Follow-Up Instructions: Return if symptoms worsen or fail to improve.   Ortho Exam  Patient is alert, oriented, no adenopathy, well-dressed, normal affect, normal respiratory effort. Examination demonstrates bilateral knees no cellulitis no erythema she has some tenderness bilaterally over all 3 compartments.  She also has grinding with range of motion  Imaging: No results found. No images are attached to the encounter.  Labs: No results found for: HGBA1C, ESRSEDRATE, CRP, LABURIC, REPTSTATUS, GRAMSTAIN, CULT, LABORGA   No results found for: ALBUMIN, PREALBUMIN, CBC  No results found for: MG No results found for: VD25OH  No results found for: PREALBUMIN CBC EXTENDED Latest Ref Rng & Units 01/24/2014 12/03/2013  WBC 4.0 - 10.5 K/uL 5.5 7.9  RBC 3.87 - 5.11 MIL/uL 3.91 3.98  HGB 12.0 - 15.0 g/dL 11.8(L) 12.2  HCT 36.0 - 46.0 % 34.7(L) 35.7(L)  PLT 150 - 400 K/uL 227 345     There is no height or weight on file to calculate BMI.  Orders:  No orders of the  defined types were placed in this encounter.  No orders of the defined types were placed in this encounter.    Procedures: Large Joint Inj: bilateral knee on 05/19/2021 2:29 PM Indications: pain Details: 18 G 1.5 in needle, anteromedial approach Medications (Right): 1 mL lidocaine 1 %; 60 mg Sodium Hyaluronate 60 MG/3ML Medications (Left): 1 mL lidocaine 1 %; 60 mg Sodium Hyaluronate 60 MG/3ML Consent was given by the patient.     Clinical Data: No additional findings.  ROS:  All other systems negative, except as noted in the HPI. Review of Systems  Objective: Vital Signs: There were no vitals taken for this visit.  Specialty Comments:  No specialty comments available.  PMFS History: There are no problems to display for this patient.  Past Medical History:  Diagnosis Date  . Anxiety   . Arthritis   . Heart murmur    ?  [name] History of kidney stones   . Hypertension     Family History  Problem Relation Age of Onset  . Hypertension Mother   . Heart attack Mother   . Cancer - Other Sister     Past Surgical History:  Procedure Laterality Date  . COLONOSCOPY     Hx: of  . DILATION AND CURETTAGE OF UTERUS    . HARDWARE REMOVAL Right 02/04/2014  Procedure: HARDWARE REMOVAL;  Surgeon: Cammy Copa, MD;  Location: [facility] OR;  Service: Orthopedics;  Laterality: Right;  REMOVAL OF HARDWARE RIGHT SHOULDER.  [name] KNEE ARTHROSCOPY     Hx: of right knee  . ORIF HUMERUS FRACTURE Right 12/03/2013   Procedure: OPEN REDUCTION INTERNAL FIXATION (ORIF) PROXIMAL HUMERUS FRACTURE;  Surgeon: Cammy Copa, MD;  Location: [facility] OR;  Service: Orthopedics;  Laterality: Right;  . TONSILLECTOMY     Social History   Occupational History  . Not on file  Tobacco Use  . Smoking status: Former    Packs/day: 1.00    Years: 25.00    Pack years: 25.00    Types: Cigarettes    Quit date: 01/25/2011    Years since quitting: 10.3  . Smokeless tobacco: Never  . Tobacco comments:    Quit  smoking cigarettes in 2014  Substance and Sexual Activity  . Alcohol use: Yes    Comment: 1 beer a week  . Drug use: No  . Sexual activity: Not on file

## 2021-08-02 ENCOUNTER — Other Ambulatory Visit: Payer: Self-pay | Admitting: Nurse Practitioner

## 2021-08-02 DIAGNOSIS — K7402 Hepatic fibrosis, advanced fibrosis: Secondary | ICD-10-CM

## 2021-08-26 ENCOUNTER — Ambulatory Visit
Admission: RE | Admit: 2021-08-26 | Discharge: 2021-08-26 | Disposition: A | Discharge status: Home / Self Care | Payer: Medicare Other | Source: Ambulatory Visit | Attending: Nurse Practitioner | Admitting: Nurse Practitioner

## 2021-08-26 DIAGNOSIS — K7402 Hepatic fibrosis, advanced fibrosis: Secondary | ICD-10-CM

## 2021-11-04 ENCOUNTER — Telehealth: Payer: Self-pay | Admitting: Orthopedic Surgery

## 2021-11-04 NOTE — Telephone Encounter (Signed)
This pt received durolane injections 05/19/21 and would like to stat the process to have both knees injected again.

## 2021-11-04 NOTE — Telephone Encounter (Signed)
Patient called needing to get the gel injection again in her knee.  Patient had  last injection on 05/19/2021. The number to contact patient is [phone]

## 2021-11-05 NOTE — Telephone Encounter (Signed)
Noted  

## 2021-11-09 ENCOUNTER — Other Ambulatory Visit: Payer: Self-pay | Admitting: Family Medicine

## 2021-11-09 DIAGNOSIS — M8580 Other specified disorders of bone density and structure, unspecified site: Secondary | ICD-10-CM

## 2021-11-12 ENCOUNTER — Telehealth: Payer: Self-pay

## 2021-11-12 NOTE — Telephone Encounter (Signed)
VOB submitted for Durolane, bilateral knee. BV pending. 

## 2021-11-15 ENCOUNTER — Telehealth: Payer: Self-pay

## 2021-11-15 NOTE — Telephone Encounter (Signed)
Approved for Durolane, bilateral knee. Buy & Bill Must meet deductible first before coverage applies. Patient is covered at 100% of the allowable. No Co-pay No PA required  Appt. 11/29/2021 with Dr. Lajoyce Corners

## 2021-11-29 ENCOUNTER — Ambulatory Visit (INDEPENDENT_AMBULATORY_CARE_PROVIDER_SITE_OTHER): Payer: Medicare Other | Admitting: Orthopedic Surgery

## 2021-11-29 ENCOUNTER — Encounter: Payer: Self-pay | Admitting: Orthopedic Surgery

## 2021-11-29 ENCOUNTER — Other Ambulatory Visit: Payer: Self-pay

## 2021-11-29 DIAGNOSIS — M1712 Unilateral primary osteoarthritis, left knee: Secondary | ICD-10-CM | POA: Diagnosis not present

## 2021-11-29 DIAGNOSIS — M1711 Unilateral primary osteoarthritis, right knee: Secondary | ICD-10-CM

## 2021-11-30 ENCOUNTER — Encounter: Payer: Self-pay | Admitting: Orthopedic Surgery

## 2021-11-30 DIAGNOSIS — M1711 Unilateral primary osteoarthritis, right knee: Secondary | ICD-10-CM

## 2021-11-30 DIAGNOSIS — M170 Bilateral primary osteoarthritis of knee: Secondary | ICD-10-CM | POA: Diagnosis not present

## 2021-11-30 NOTE — Progress Notes (Signed)
Office Visit Note   Patient: Amy Dunlap           Date of Birth: [birth year]           MRN: [id number] Visit Date: 11/29/2021              Requested by: Lupita Raider, MD [address] [place],  [place] [address] PCP: Lupita Raider, MD  Chief Complaint  Patient presents with   Right Knee - Follow-up    Durolane   Left Knee - Follow-up    Durolane      HPI: Patient is a 71 year old woman with osteoarthritis both knees.  Patient states she still has persistent pain and symptoms with activities of daily living.  Assessment & Plan: Visit Diagnoses:  1. Primary osteoarthritis of left knee   2. Primary osteoarthritis of right knee     Plan: Both knees were injected with Durolane.  She tolerated this well follow-up as needed  Follow-Up Instructions: Return if symptoms worsen or fail to improve.   Ortho Exam  Patient is alert, oriented, no adenopathy, well-dressed, normal affect, normal respiratory effort. Examination there is no effusion no redness no cellulitis in either knee.  Imaging: No results found. No images are attached to the encounter.  Labs: No results found for: HGBA1C, ESRSEDRATE, CRP, LABURIC, REPTSTATUS, GRAMSTAIN, CULT, LABORGA   No results found for: ALBUMIN, PREALBUMIN, CBC  No results found for: MG No results found for: VD25OH  No results found for: PREALBUMIN CBC EXTENDED Latest Ref Rng & Units 01/24/2014 12/03/2013  WBC 4.0 - 10.5 K/uL 5.5 7.9  RBC 3.87 - 5.11 MIL/uL 3.91 3.98  HGB 12.0 - 15.0 g/dL 11.8(L) 12.2  HCT 36.0 - 46.0 % 34.7(L) 35.7(L)  PLT 150 - 400 K/uL 227 345     There is no height or weight on file to calculate BMI.  Orders:  No orders of the defined types were placed in this encounter.  No orders of the defined types were placed in this encounter.    Procedures: Large Joint Inj: bilateral knee on 11/30/2021 11:34 AM Indications: pain and diagnostic evaluation Details: 22 G 1.5 in needle, anteromedial  approach  Arthrogram: No  Outcome: tolerated well, no immediate complications Procedure, treatment alternatives, risks and benefits explained, specific risks discussed. Consent was given by the patient. Immediately prior to procedure a time out was called to verify the correct patient, procedure, equipment, support staff and site/side marked as required. Patient was prepped and draped in the usual sterile fashion.     Clinical Data: No additional findings.  ROS:  All other systems negative, except as noted in the HPI. Review of Systems  Objective: Vital Signs: There were no vitals taken for this visit.  Specialty Comments:  No specialty comments available.  PMFS History: There are no problems to display for this patient.  Past Medical History:  Diagnosis Date   Anxiety    Arthritis    Heart murmur    ?   History of kidney stones    Hypertension     Family History  Problem Relation Age of Onset   Hypertension Mother    Heart attack Mother    Cancer - Other Sister     Past Surgical History:  Procedure Laterality Date   COLONOSCOPY     Hx: of   DILATION AND CURETTAGE OF UTERUS     HARDWARE REMOVAL Right 02/04/2014   Procedure: HARDWARE REMOVAL;  Surgeon: Cammy Copa,  MD;  Location: [facility] OR;  Service: Orthopedics;  Laterality: Right;  REMOVAL OF HARDWARE RIGHT SHOULDER.   KNEE ARTHROSCOPY     Hx: of right knee   ORIF HUMERUS FRACTURE Right 12/03/2013   Procedure: OPEN REDUCTION INTERNAL FIXATION (ORIF) PROXIMAL HUMERUS FRACTURE;  Surgeon: Cammy Copa, MD;  Location: [facility] OR;  Service: Orthopedics;  Laterality: Right;   TONSILLECTOMY     Social History   Occupational History   Not on file  Tobacco Use   Smoking status: Former    Packs/day: 1.00    Years: 25.00    Pack years: 25.00    Types: Cigarettes    Quit date: 01/25/2011    Years since quitting: 10.8   Smokeless tobacco: Never   Tobacco comments:    Quit smoking cigarettes in 2014  Substance  and Sexual Activity   Alcohol use: Yes    Comment: 1 beer a week   Drug use: No   Sexual activity: Not on file

## 2022-02-22 ENCOUNTER — Other Ambulatory Visit: Payer: Self-pay | Admitting: Nurse Practitioner

## 2022-02-22 DIAGNOSIS — K7402 Hepatic fibrosis, advanced fibrosis: Secondary | ICD-10-CM

## 2022-03-03 ENCOUNTER — Ambulatory Visit
Admission: RE | Admit: 2022-03-03 | Discharge: 2022-03-03 | Disposition: A | Discharge status: Home / Self Care | Payer: Medicare Other | Source: Ambulatory Visit | Attending: Nurse Practitioner | Admitting: Nurse Practitioner

## 2022-03-03 DIAGNOSIS — K7402 Hepatic fibrosis, advanced fibrosis: Secondary | ICD-10-CM

## 2022-04-18 ENCOUNTER — Encounter: Payer: Self-pay | Admitting: Cardiovascular Disease

## 2022-04-19 ENCOUNTER — Encounter: Payer: Self-pay | Admitting: Cardiovascular Disease

## 2022-04-19 ENCOUNTER — Ambulatory Visit (INDEPENDENT_AMBULATORY_CARE_PROVIDER_SITE_OTHER): Payer: Medicare Other | Admitting: Cardiovascular Disease

## 2022-04-19 VITALS — BP 142/72 | HR 47 | Ht 66.5 in | Wt 159.6 lb

## 2022-04-19 DIAGNOSIS — Z8249 Family history of ischemic heart disease and other diseases of the circulatory system: Secondary | ICD-10-CM | POA: Diagnosis not present

## 2022-04-19 DIAGNOSIS — R001 Bradycardia, unspecified: Secondary | ICD-10-CM | POA: Diagnosis not present

## 2022-05-02 ENCOUNTER — Ambulatory Visit (HOSPITAL_BASED_OUTPATIENT_CLINIC_OR_DEPARTMENT_OTHER)
Admission: RE | Admit: 2022-05-02 | Discharge: 2022-05-02 | Disposition: A | Discharge status: Home / Self Care | Payer: Medicare Other | Source: Ambulatory Visit | Attending: Cardiovascular Disease | Admitting: Cardiovascular Disease

## 2022-05-02 DIAGNOSIS — Z8249 Family history of ischemic heart disease and other diseases of the circulatory system: Secondary | ICD-10-CM | POA: Insufficient documentation

## 2022-05-05 ENCOUNTER — Telehealth: Payer: Self-pay | Admitting: Cardiovascular Disease

## 2022-05-05 NOTE — Telephone Encounter (Signed)
[facility] call back for Amy Dunlap in regards to LDO level 96 and lab drawn back in 10/20/21.

## 2022-05-09 ENCOUNTER — Ambulatory Visit
Admission: RE | Admit: 2022-05-09 | Discharge: 2022-05-09 | Disposition: A | Discharge status: Home / Self Care | Payer: Medicare Other | Source: Ambulatory Visit | Attending: Family Medicine | Admitting: Family Medicine

## 2022-05-09 DIAGNOSIS — M8580 Other specified disorders of bone density and structure, unspecified site: Secondary | ICD-10-CM

## 2022-05-10 ENCOUNTER — Telehealth: Payer: Self-pay

## 2022-05-10 DIAGNOSIS — Z79899 Other long term (current) drug therapy: Secondary | ICD-10-CM

## 2022-05-10 MED ORDER — ROSUVASTATIN CALCIUM 20 MG PO TABS: 20.0000 mg | ORAL_TABLET | Freq: Every day | ORAL | 3 refills | Status: DC

## 2022-05-10 NOTE — Telephone Encounter (Signed)
3 hours ago (9:46 AM)   JW Patient called to report her liver doctor said it was OK for her to take the statin.     Returned call to patient. Rosuvastatin sent to pharmacy on file, repeat labs ordered and scheduled for 08/10/22.

## 2022-05-10 NOTE — Telephone Encounter (Signed)
Patient called to report her liver doctor said it was OK for her to take the statin.

## 2022-05-10 NOTE — Telephone Encounter (Signed)
-----  Message from Thayer Headings, MD sent at 05/03/2022  6:39 AM EDT ----- Coronary calcium score is 243 which places her in the 84th percentile for age / gender matched controls Her LDL goal is ~55. I dont have a copy of recent labs. If she has labs from her primary MD Amy Dunlap) please have her send them to [place] Start rosuvastatin 20 mg a day Check lipids, ALT , BMP in 3 months    There is a lytic leasion in the T9 vertebral body. Recommend work up for multiple myeloma. She will need to contact Dr. [name] for this  I have copied Dr. [name] on this result note.

## 2022-05-10 NOTE — Telephone Encounter (Signed)
Returned call to patient to let her know that I would send medication into pharmacy and schedule repeat labs in 3 months.

## 2022-07-13 IMAGING — US US ABDOMEN LIMITED RUQ/ASCITES
1 series · 14 of 25 positions shown · non-contrast
Comparison: Abdominal ultrasound August 24, 2020

CLINICAL DATA: Advanced cirrhosis

EXAM:
ULTRASOUND ABDOMEN LIMITED RIGHT UPPER QUADRANT

[Series 1: us abdomen limited ruq/ascites · 0.15mm/px · 14 of 46 slices shown]
[im 1/46]
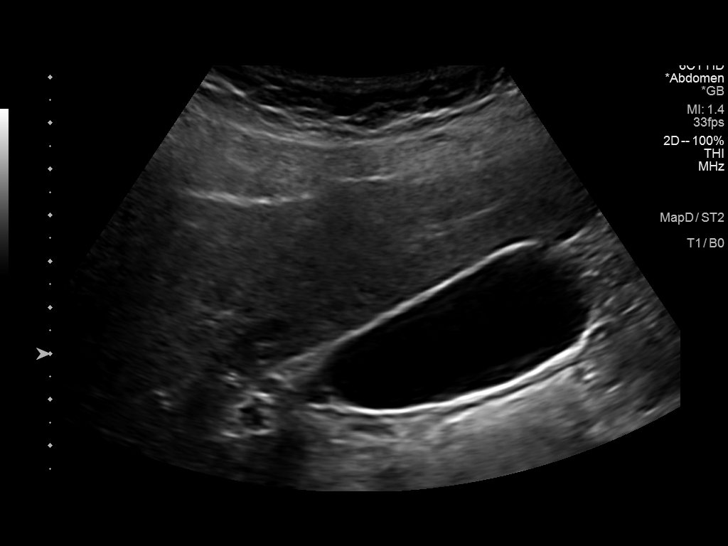
[im 4/46]
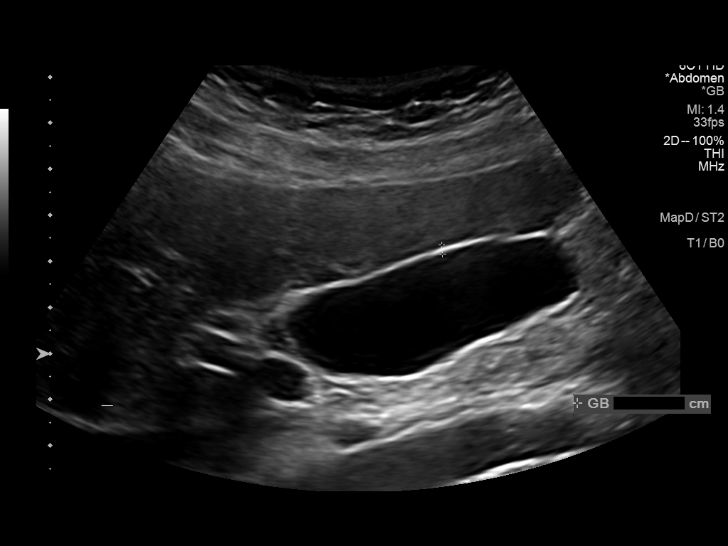
[im 8/46]
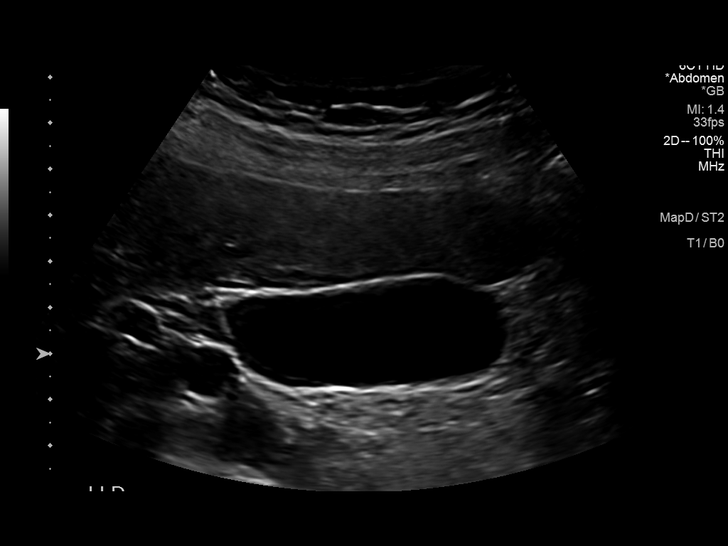
[im 12/46]
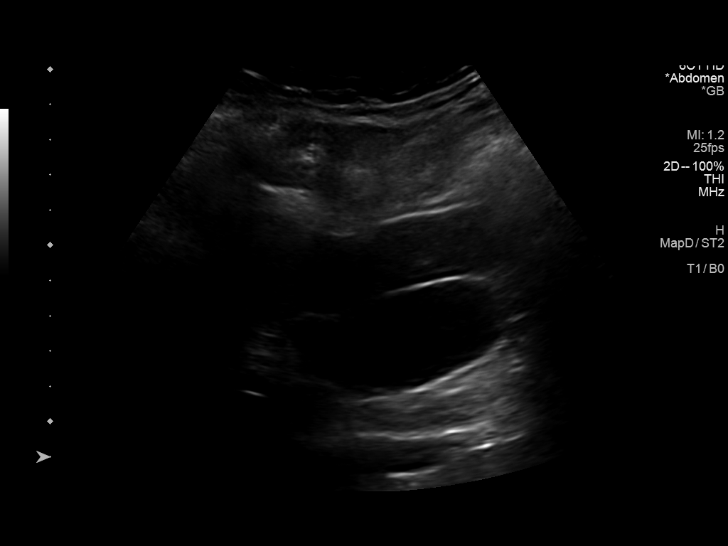
[im 16/46]
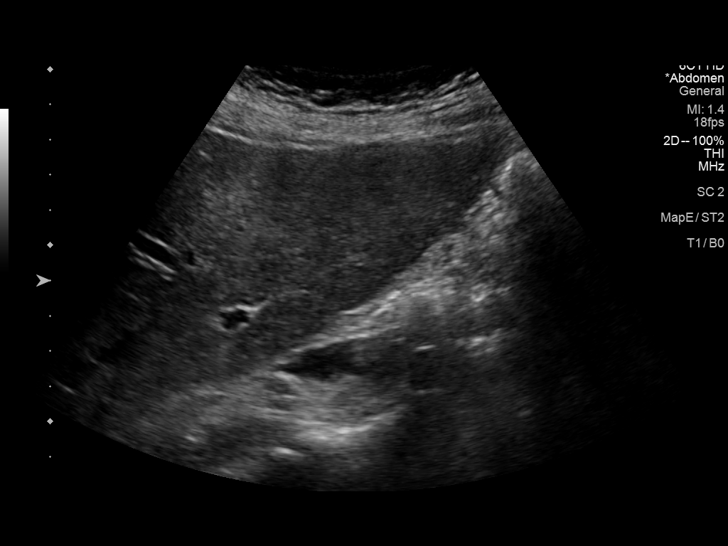
[im 17/46]
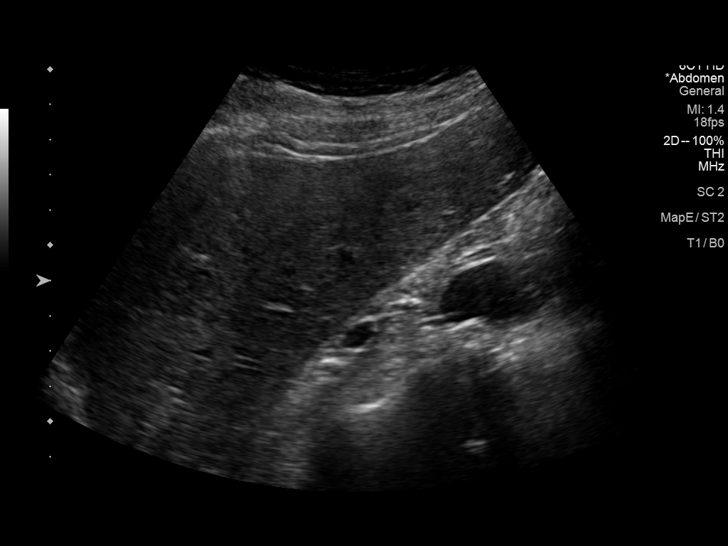
[im 21/46]
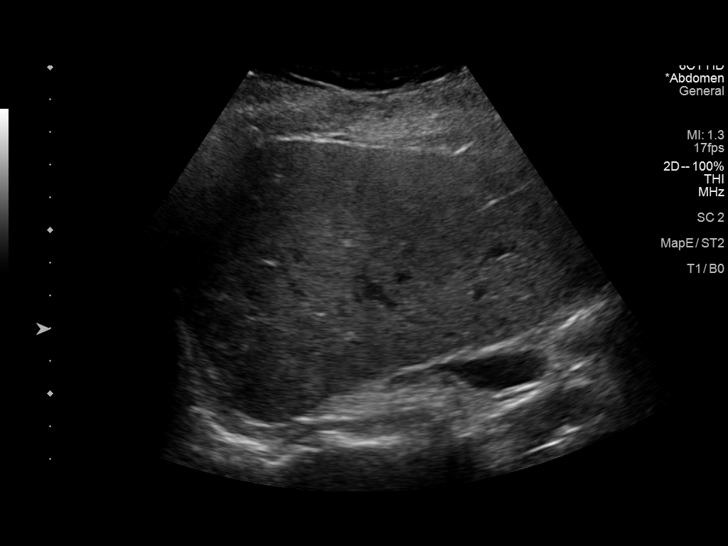
[im 25/46]
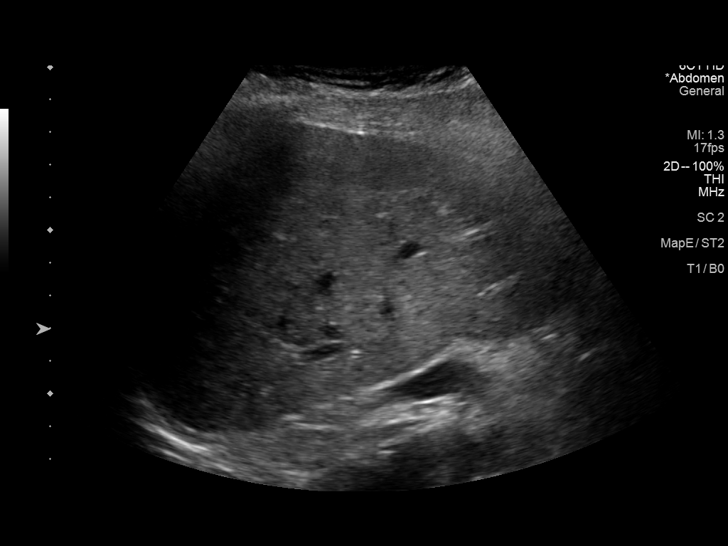
[im 29/46]
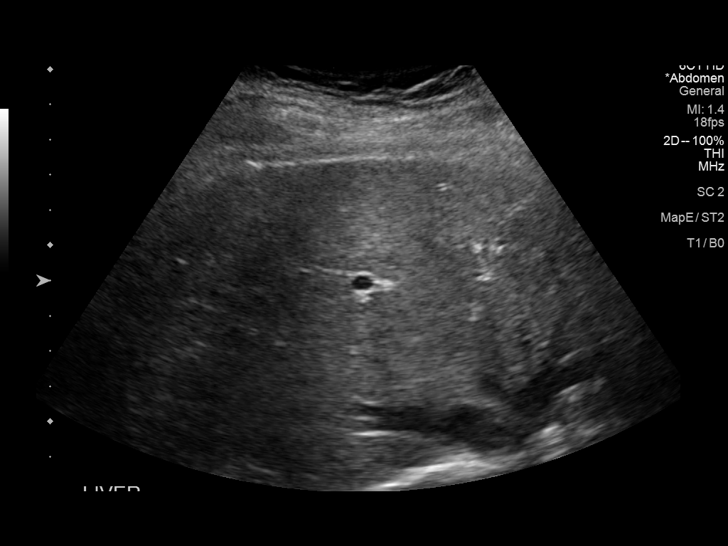
[im 31/46]
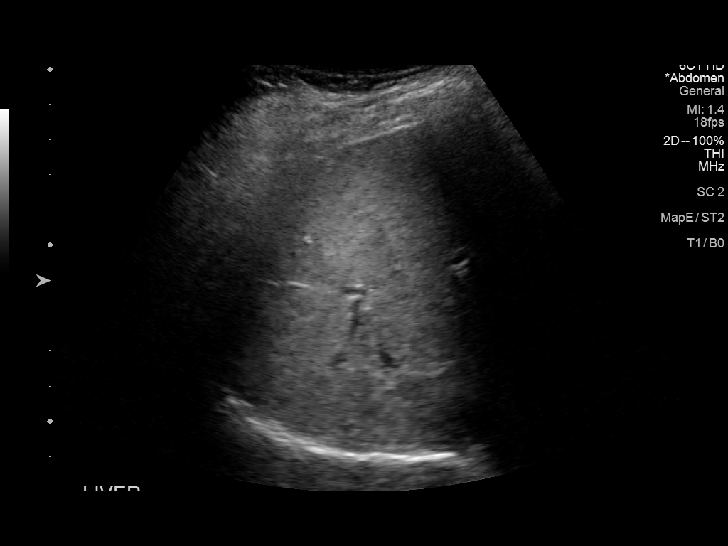
[im 34/46]
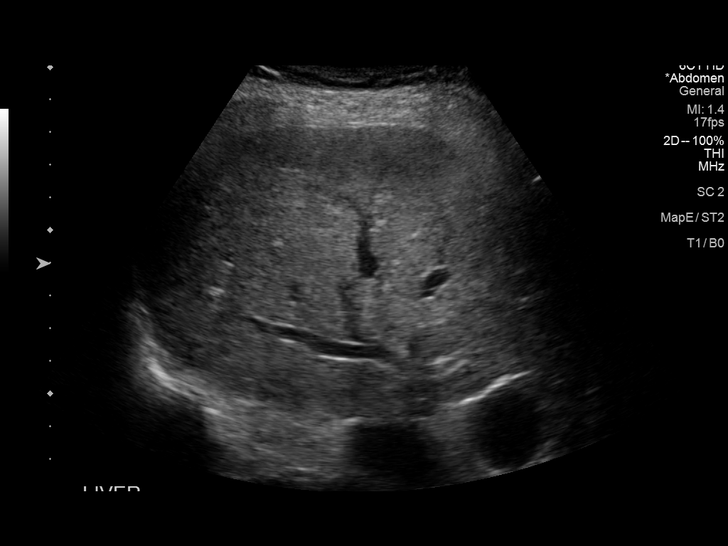
[im 38/46]
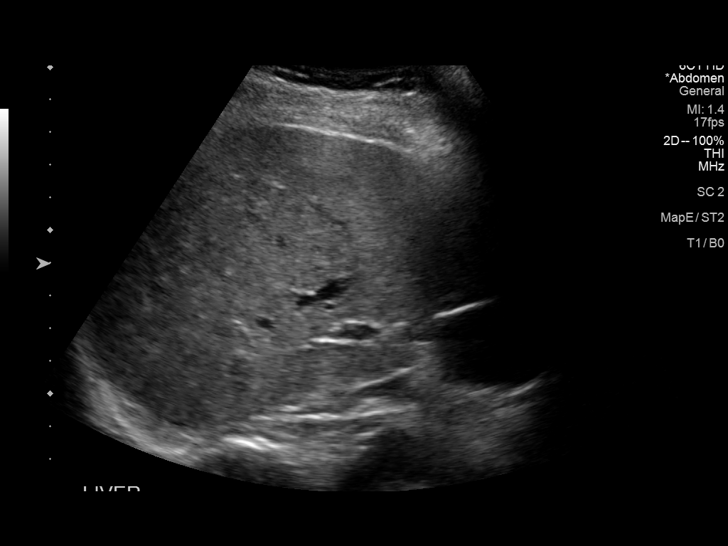
[im 42/46]
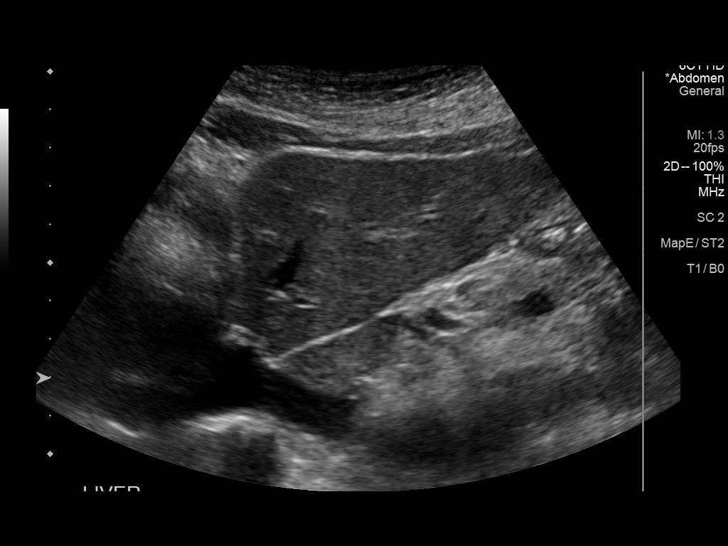
[im 46/46]
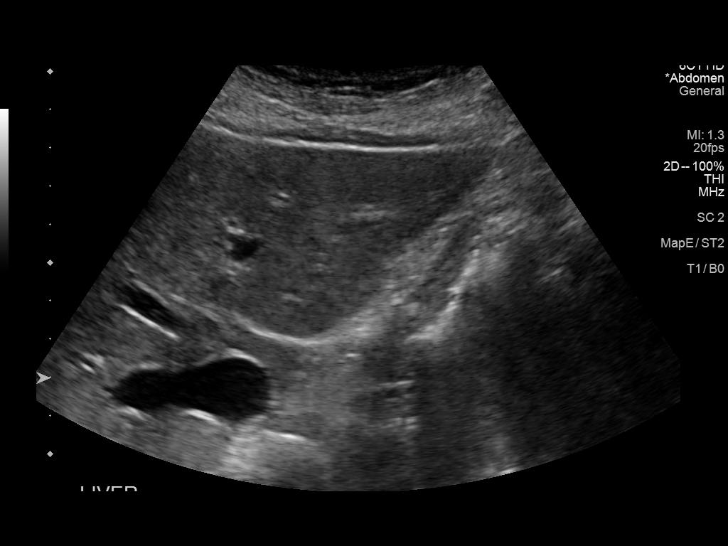

[14 of 25 positions shown; findings below may reference images not displayed]

FINDINGS: Gallbladder:

No gallstones or wall thickening visualized. No sonographic Murphy
sign noted by sonographer.

Common bile duct:

Diameter: 3 mm

Liver:

No focal lesion identified. Heterogeneous coarsened hepatic
echotexture with mild nodularity along the hepatic margins,
consistent with given diagnosis of cirrhosis. Portal vein is patent
on color Doppler imaging with normal direction of blood flow towards
the liver.

Other: None.
IMPRESSION: 1. Coarsened heterogeneous hepatic echotexture with mild nodular
hepatic contour, consistent with given diagnosis of cirrhosis. No
focal hepatic lesion is identified.

## 2022-08-10 ENCOUNTER — Ambulatory Visit: Payer: Medicare Other | Attending: Cardiovascular Disease

## 2022-08-10 DIAGNOSIS — Z79899 Other long term (current) drug therapy: Secondary | ICD-10-CM

## 2022-08-10 LAB — BASIC METABOLIC PANEL
BUN/Creatinine Ratio: 22 (ref 12–28)
BUN: 29 mg/dL — ABNORMAL HIGH (ref 8–27)
CO2: 28 mmol/L (ref 20–29)
Calcium: 9.7 mg/dL (ref 8.7–10.3)
Chloride: 101 mmol/L (ref 96–106)
Creatinine, Ser: 1.3 mg/dL — ABNORMAL HIGH (ref 0.57–1.00)
Glucose: 84 mg/dL (ref 70–99)
Potassium: 4.6 mmol/L (ref 3.5–5.2)
Sodium: 141 mmol/L (ref 134–144)
eGFR: 44 mL/min/{1.73_m2} — ABNORMAL LOW (ref >59)

## 2022-08-10 LAB — LIPID PANEL
Chol/HDL Ratio: 2.6 ratio (ref 0.0–4.4)
Cholesterol, Total: 174 mg/dL (ref 100–199)
HDL: 68 mg/dL (ref >39)
LDL Chol Calc (NIH): 96 mg/dL (ref 0–99)
Triglycerides: 47 mg/dL (ref 0–149)
VLDL Cholesterol Cal: 10 mg/dL (ref 5–40)

## 2022-08-10 LAB — ALT: ALT: 11 IU/L (ref 0–32)

## 2022-08-12 ENCOUNTER — Telehealth: Payer: Self-pay

## 2022-08-12 DIAGNOSIS — Z79899 Other long term (current) drug therapy: Secondary | ICD-10-CM

## 2022-08-12 NOTE — Telephone Encounter (Signed)
Called and spoke with patient who states she couldn't tolerate the Crestor 20mg  daily d/t muscle and joint aches she was experiencing from it. Educated patient about statin medications and she is agreeable to re-trial the medication at 1/2 tablet daily. Advised that if she cannot tolerate the 10mg  daily, to tale 1 week off and restart at every other day dosing. She will do this and will return for labs in 3 months.

## 2022-08-12 NOTE — Telephone Encounter (Signed)
-----   Message from Thayer Headings, MD sent at 08/12/2022  7:48 AM EDT ----- BMP is stable from previous levels  ALT is normal  LDL is 96.  Her goal LDL is 55. She is on rosuvastatin ,  please add Zetia 10 mg a day  Recheck lipids and ALT in 3 months

## 2022-08-29 ENCOUNTER — Other Ambulatory Visit: Payer: Self-pay | Admitting: Nurse Practitioner

## 2022-08-29 DIAGNOSIS — K7402 Hepatic fibrosis, advanced fibrosis: Secondary | ICD-10-CM

## 2022-09-07 ENCOUNTER — Inpatient Hospital Stay: Admission: RE | Admit: 2022-09-07 | Payer: Medicare Other | Source: Ambulatory Visit

## 2022-09-14 ENCOUNTER — Ambulatory Visit
Admission: RE | Admit: 2022-09-14 | Discharge: 2022-09-14 | Disposition: A | Discharge status: Home / Self Care | Payer: Medicare Other | Source: Ambulatory Visit | Attending: Nurse Practitioner | Admitting: Nurse Practitioner

## 2022-09-14 DIAGNOSIS — K7402 Hepatic fibrosis, advanced fibrosis: Secondary | ICD-10-CM

## 2022-10-27 ENCOUNTER — Other Ambulatory Visit: Payer: Self-pay | Admitting: Family Medicine

## 2022-10-27 ENCOUNTER — Ambulatory Visit
Admission: RE | Admit: 2022-10-27 | Discharge: 2022-10-27 | Disposition: A | Discharge status: Home / Self Care | Payer: Medicare Other | Source: Ambulatory Visit | Attending: Family Medicine | Admitting: Family Medicine

## 2022-10-27 DIAGNOSIS — L989 Disorder of the skin and subcutaneous tissue, unspecified: Secondary | ICD-10-CM

## 2022-11-04 ENCOUNTER — Other Ambulatory Visit (HOSPITAL_COMMUNITY): Payer: Self-pay | Admitting: Family Medicine

## 2022-11-04 DIAGNOSIS — M899 Disorder of bone, unspecified: Secondary | ICD-10-CM

## 2022-11-14 ENCOUNTER — Encounter (HOSPITAL_COMMUNITY)
Admission: RE | Admit: 2022-11-14 | Discharge: 2022-11-14 | Disposition: A | Discharge status: Home / Self Care | Payer: Medicare Other | Source: Ambulatory Visit | Attending: Family Medicine | Admitting: Family Medicine

## 2022-11-14 ENCOUNTER — Ambulatory Visit (HOSPITAL_COMMUNITY)
Admission: RE | Admit: 2022-11-14 | Discharge: 2022-11-14 | Disposition: A | Discharge status: Home / Self Care | Payer: Medicare Other | Source: Ambulatory Visit | Attending: Family Medicine | Admitting: Family Medicine

## 2022-11-14 DIAGNOSIS — M898X9 Other specified disorders of bone, unspecified site: Secondary | ICD-10-CM

## 2022-11-14 DIAGNOSIS — M899 Disorder of bone, unspecified: Secondary | ICD-10-CM | POA: Insufficient documentation

## 2022-11-14 MED ORDER — TECHNETIUM TC 99M MEDRONATE IV KIT
20.0000 | PACK | Freq: Once | INTRAVENOUS | Status: AC | PRN
  Administered 2022-11-14: 21.2 via INTRAVENOUS

## 2022-11-15 ENCOUNTER — Other Ambulatory Visit: Payer: Medicare Other

## 2023-01-14 IMAGING — US US ABDOMEN LIMITED
1 series · 14 of 25 positions shown · non-contrast
Comparison: February 22, 2021

CLINICAL DATA: Hepatic fibrosis.

EXAM:
ULTRASOUND ABDOMEN LIMITED RIGHT UPPER QUADRANT

[Series 1: us abdomen limited · 0.17mm/px · 14 of 49 slices shown]
[im 1/49]
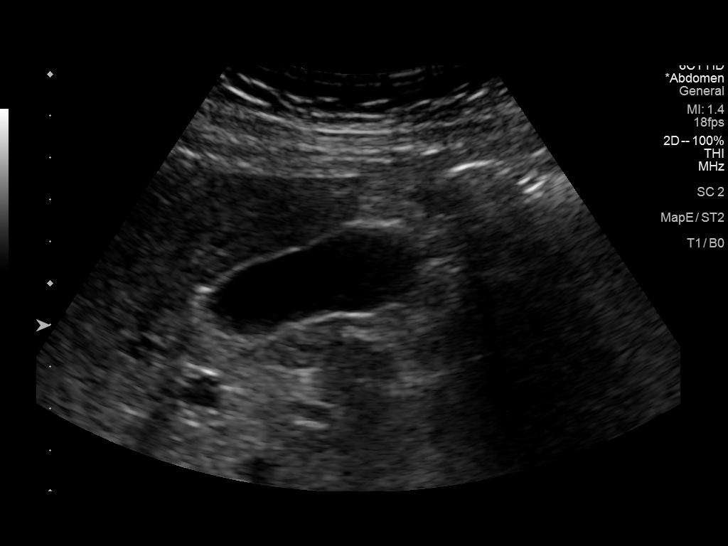
[im 5/49]
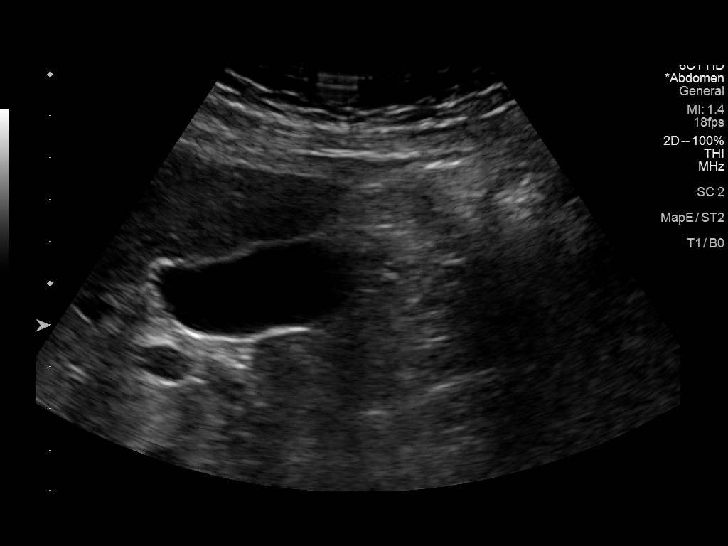
[im 9/49]
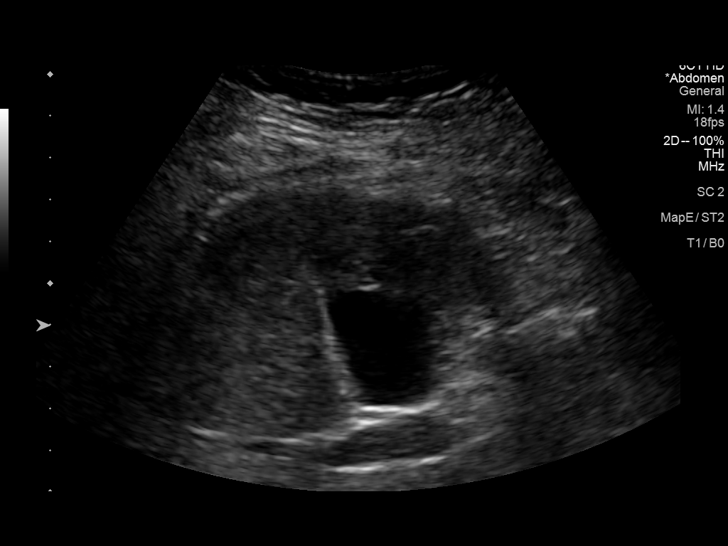
[im 13/49]
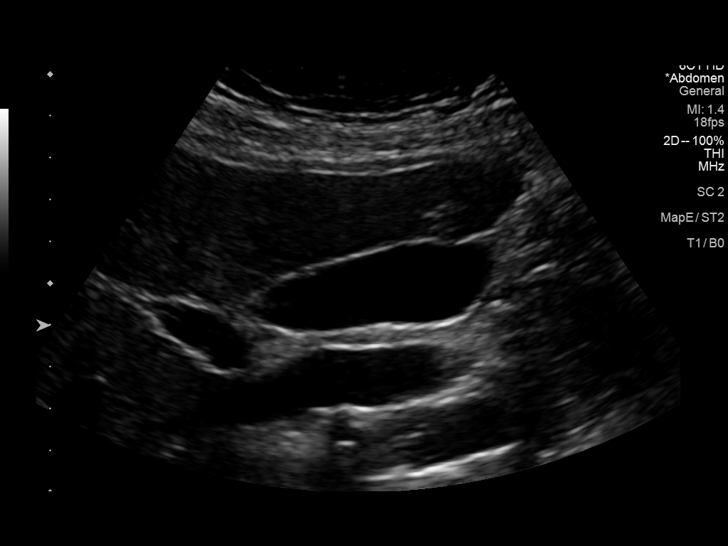
[im 17/49]
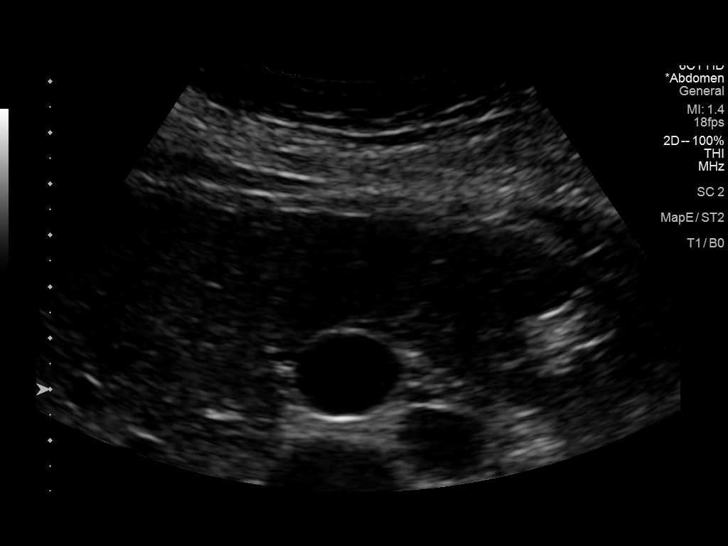
[im 19/49]
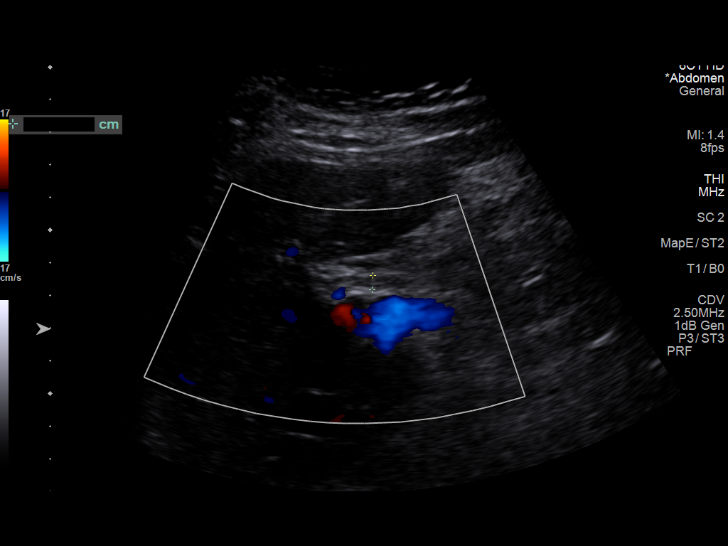
[im 23/49]
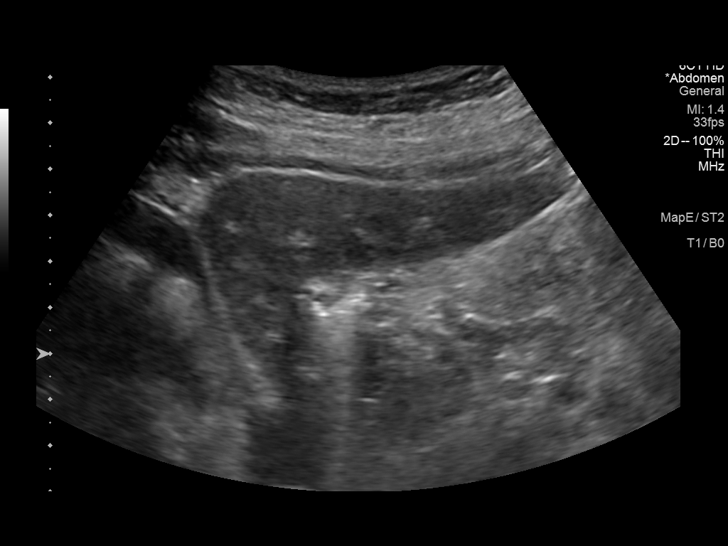
[im 27/49]
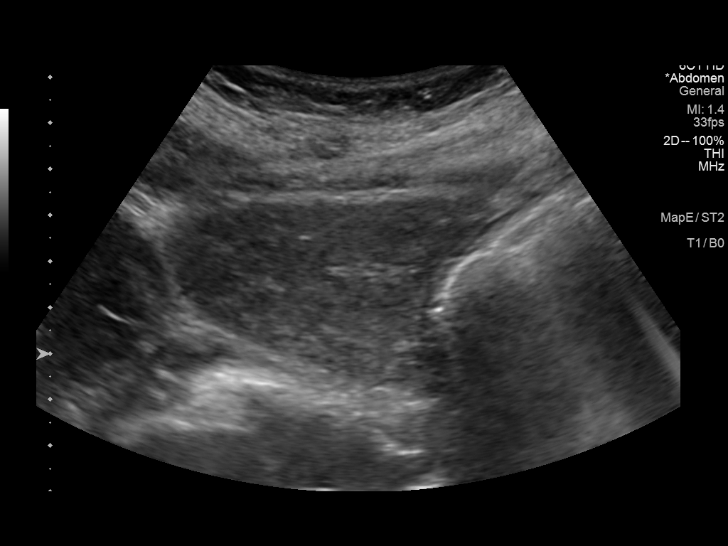
[im 31/49]
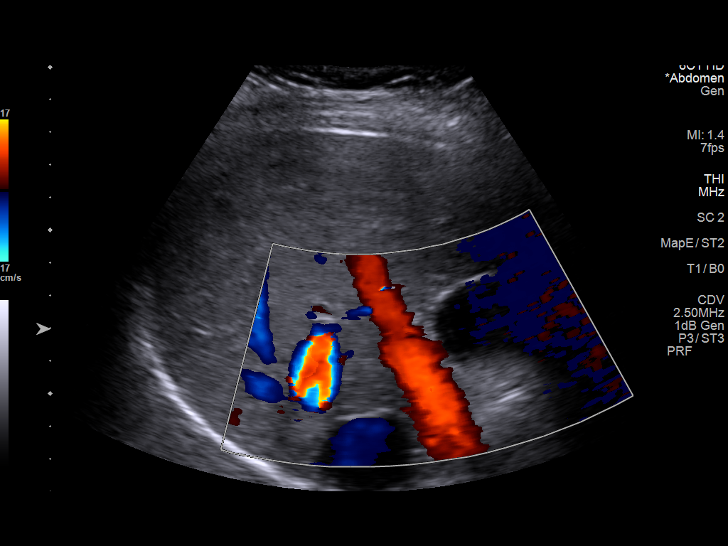
[im 33/49]
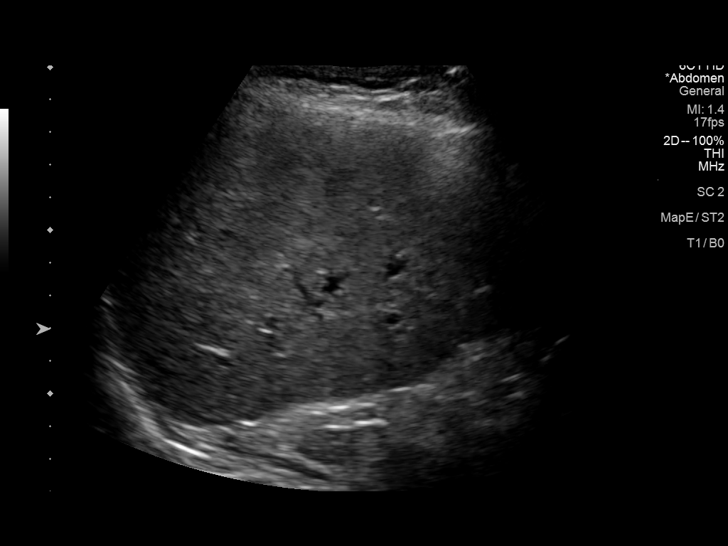
[im 37/49]
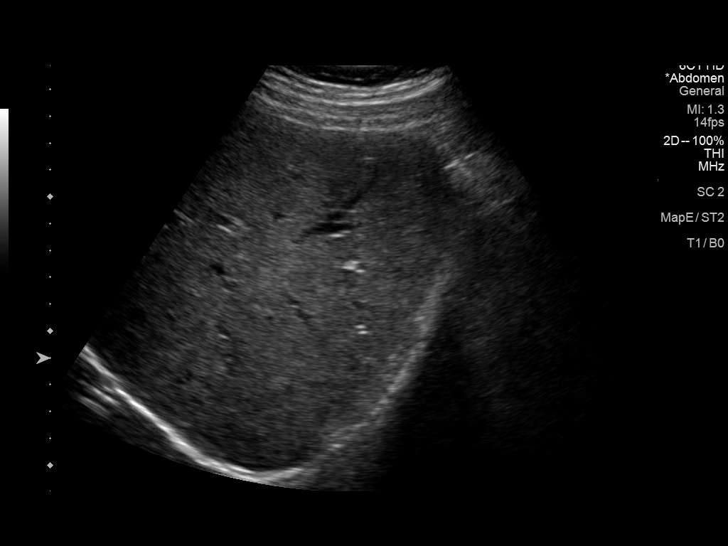
[im 41/49]
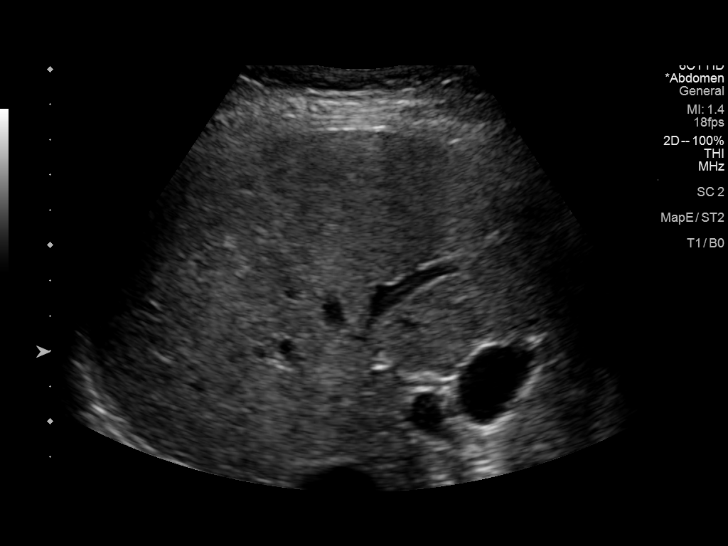
[im 45/49]
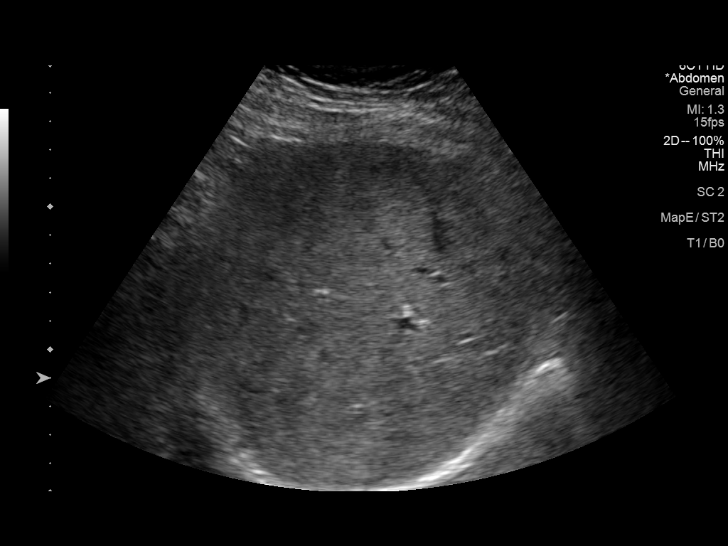
[im 49/49]
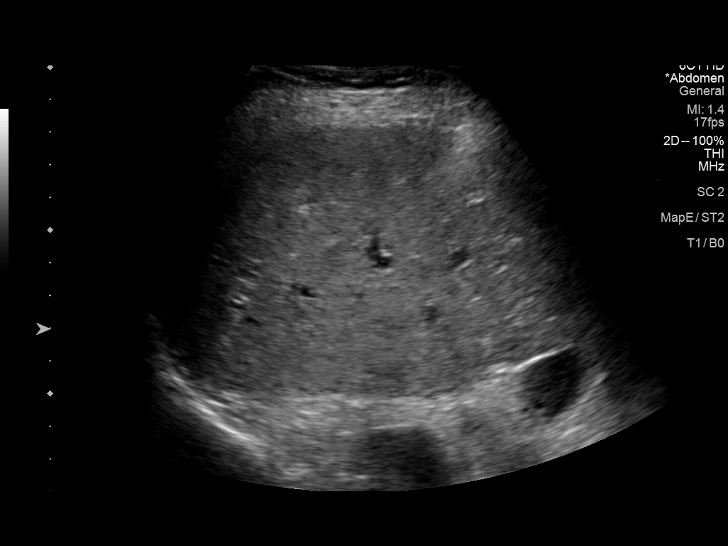

[14 of 25 positions shown; findings below may reference images not displayed]

FINDINGS: Gallbladder:

No gallstones or wall thickening visualized (1.7 mm). No sonographic
Murphy sign noted by sonographer.

Common bile duct:

Diameter: 0.4 mm

Liver:

No focal lesion identified. The liver parenchyma is nodular in
echotexture and diffusely increased in echogenicity. Portal vein is
patent on color Doppler imaging with normal direction of blood flow
towards the liver.

Other: None.
IMPRESSION: Stable findings consistent with hepatic cirrhosis in the absence of
focal liver lesions.

## 2023-02-27 ENCOUNTER — Other Ambulatory Visit: Payer: Self-pay | Admitting: Nurse Practitioner

## 2023-02-27 DIAGNOSIS — K7402 Hepatic fibrosis, advanced fibrosis: Secondary | ICD-10-CM

## 2023-03-27 ENCOUNTER — Encounter: Payer: Self-pay | Admitting: Orthopedic Surgery

## 2023-03-27 ENCOUNTER — Other Ambulatory Visit: Payer: Self-pay

## 2023-03-27 ENCOUNTER — Ambulatory Visit (INDEPENDENT_AMBULATORY_CARE_PROVIDER_SITE_OTHER): Payer: Medicare Other | Admitting: Orthopedic Surgery

## 2023-03-27 ENCOUNTER — Other Ambulatory Visit (INDEPENDENT_AMBULATORY_CARE_PROVIDER_SITE_OTHER): Payer: Medicare Other

## 2023-03-27 DIAGNOSIS — M1711 Unilateral primary osteoarthritis, right knee: Secondary | ICD-10-CM | POA: Diagnosis not present

## 2023-03-27 DIAGNOSIS — M25561 Pain in right knee: Secondary | ICD-10-CM | POA: Diagnosis not present

## 2023-03-27 DIAGNOSIS — G8929 Other chronic pain: Secondary | ICD-10-CM

## 2023-03-27 MED ORDER — METHYLPREDNISOLONE ACETATE 40 MG/ML IJ SUSP
40.0000 mg | INTRAMUSCULAR | Status: AC | PRN
  Administered 2023-03-27: 40 mg via INTRA_ARTICULAR

## 2023-03-27 MED ORDER — LIDOCAINE HCL 1 % IJ SOLN
5.0000 mL | INTRAMUSCULAR | Status: AC | PRN
  Administered 2023-03-27: 5 mL

## 2023-03-27 MED ORDER — BUPIVACAINE HCL 0.25 % IJ SOLN
4.0000 mL | INTRAMUSCULAR | Status: AC | PRN
  Administered 2023-03-27: 4 mL via INTRA_ARTICULAR

## 2023-03-27 NOTE — Progress Notes (Signed)
Office Visit Note   Patient: Amy Dunlap           Date of Birth: [birth year]           MRN: [id number] Visit Date: 03/27/2023 Requested by: Lupita Raider, MD [address] [place],  [place] [address] PCP: Lupita Raider, MD  Subjective: Chief Complaint  Patient presents with   Right Knee - Pain    HPI: Amy Dunlap is a 72 y.o. female who presents to the office reporting right knee pain for years.  The knee pain has worsened recently.  Describes medial pain with no grinding or giving way.  Uses over-the-counter pain patches along with Advil.  She has moved to [place].  She likes doing pickleball yoga and water aerobics.  She states her shoulder is doing well.  Injections have helped her knee in the past..                ROS: All systems reviewed are negative as they relate to the chief complaint within the history of present illness.  Patient denies fevers or chills.  Assessment & Plan: Visit Diagnoses:  1. Chronic pain of right knee     Plan: Impression is right knee pain with arthritis.  Injection performed today.  Continue with current exercise regimen.  She has lost about 20 pounds which will help her mobility.  She will follow-up as needed.  Follow-Up Instructions: No follow-ups on file.   Orders:  Orders Placed This Encounter  Procedures   XR KNEE 3 VIEW RIGHT   US Guided Needle Placement - No Linked Charges   No orders of the defined types were placed in this encounter.     Procedures: Large Joint Inj: R knee on 03/27/2023 7:20 PM Indications: diagnostic evaluation, joint swelling and pain Details: 18 G 1.5 in needle, superolateral approach  Arthrogram: No  Medications: 5 mL lidocaine 1 %; 40 mg methylPREDNISolone acetate 40 MG/ML; 4 mL bupivacaine 0.25 % Outcome: tolerated well, no immediate complications Procedure, treatment alternatives, risks and benefits explained, specific risks discussed. Consent was given by the patient. Immediately  prior to procedure a time out was called to verify the correct patient, procedure, equipment, support staff and site/side marked as required. Patient was prepped and draped in the usual sterile fashion.       Clinical Data: No additional findings.  Objective: Vital Signs: There were no vitals taken for this visit.  Physical Exam:  Constitutional: Patient appears well-developed HEENT:  Head: Normocephalic Eyes:EOM are normal Neck: Normal range of motion Cardiovascular: Normal rate Pulmonary/chest: Effort normal Neurologic: Patient is alert Skin: Skin is warm Psychiatric: Patient has normal mood and affect  Ortho Exam: Ortho exam demonstrates no effusion in the right knee.  Stable collateral cruciate ligaments.  Extensor mechanism intact.  Achieves full extension and flexes to about 115.  Collateral crucial ligaments are stable.  Mild patellofemoral crepitus is present.  Specialty Comments:  No specialty comments available.  Imaging: XR KNEE 3 VIEW RIGHT  Result Date: 03/27/2023 AP lateral merchant radiographs right knee reviewed.  Mild varus alignment is present.  End-stage tricompartmental arch arthritis is present with bone-on-bone changes in the medial compartment and moderate degenerative changes present in the patellofemoral and lateral compartment.  No acute fracture.    PMFS History: There are no problems to display for this patient.  Past Medical History:  Diagnosis Date   Anxiety    Arthritis    Heart murmur    ?  History of kidney stones    Hypertension     Family History  Problem Relation Age of Onset   Hypertension Mother    Heart attack Mother    Cancer - Other Sister     Past Surgical History:  Procedure Laterality Date   COLONOSCOPY     Hx: of   DILATION AND CURETTAGE OF UTERUS     HARDWARE REMOVAL Right 02/04/2014   Procedure: HARDWARE REMOVAL;  Surgeon: Cammy Copa, MD;  Location: [facility] OR;  Service: Orthopedics;  Laterality: Right;   REMOVAL OF HARDWARE RIGHT SHOULDER.   KNEE ARTHROSCOPY     Hx: of right knee   ORIF HUMERUS FRACTURE Right 12/03/2013   Procedure: OPEN REDUCTION INTERNAL FIXATION (ORIF) PROXIMAL HUMERUS FRACTURE;  Surgeon: Cammy Copa, MD;  Location: [facility] OR;  Service: Orthopedics;  Laterality: Right;   TONSILLECTOMY     Social History   Occupational History   Not on file  Tobacco Use   Smoking status: Former    Packs/day: 1.00    Years: 25.00    Additional pack years: 0.00    Total pack years: 25.00    Types: Cigarettes    Quit date: 01/25/2011    Years since quitting: 12.1   Smokeless tobacco: Never   Tobacco comments:    Quit smoking cigarettes in 2014  Substance and Sexual Activity   Alcohol use: Yes    Comment: 1 beer a week   Drug use: No   Sexual activity: Not on file

## 2023-04-05 ENCOUNTER — Other Ambulatory Visit: Payer: Medicare Other

## 2023-04-05 ENCOUNTER — Ambulatory Visit
Admission: RE | Admit: 2023-04-05 | Discharge: 2023-04-05 | Disposition: A | Discharge status: Home / Self Care | Payer: Medicare Other | Source: Ambulatory Visit | Attending: Nurse Practitioner | Admitting: Nurse Practitioner

## 2023-04-05 DIAGNOSIS — K7402 Hepatic fibrosis, advanced fibrosis: Secondary | ICD-10-CM

## 2023-05-10 NOTE — Progress Notes (Signed)
Cardiology Office Note:  .   Date:  05/22/2023  ID:  Amy Dunlap, Amy Dunlap 1951/09/10, MRN [id number] PCP: Lupita Raider, MD  [facility] Providers Cardiologist:  None    Patient Profile: .      PMH: Bradycardia CAD Coronary artery calcium score 291 (84th percentile) 04/2022 Hyperlipidemia Orthostasis Hypertension Family hx CAD Aortic atherosclerosis Hepatitis C  Referred to cardiology for evaluation of bradycardia and seen by Dr. Elease Hashimoto 10/2019.  She did not have symptoms of presyncope or syncope.  Heart rate increased to 95 bpm with step test, intact chronotropic response.  Advised to follow-up yearly for EKGs.  She returned 04/19/2022 with symptoms of orthostasis.  She had recently had lisinopril/HCTZ dose lowered.  Concerned about family history of CAD.  Advised to undergo CT calcium score completed on 05/02/22 and revealing coronary calcium score of 291 (84th percentile).  There was a lytic lesion identified on the T9 vertebral body, she was recommended to follow-up with PCP for this.  She was advised to start rosuvastatin 20 mg daily for elevated LDL.  She called back 07/2022 to report muscle and joint pain from Crestor.  Agreeable to try 10 mg daily.        History of Present Illness: .   Amy Dunlap is a very pleasant 72 y.o. female who is here today for follow-up. She is overall feeling well. Has noted that she is more fatigued over the past few months, particularly more so than a year ago. She remains active playing pickleball, walking, and doing yoga. Sleeps about 12 hours per night. Recent episodes of dizziness while coming down off a ladder. She fell and hurt her wrist. No syncope. On a separate occasion, she was walking and lost her balance. Her cousin kept her from falling. Her mother passed away at her age, thought to be due to heart attack. She denies chest pain, shortness of breath, palpitations, orthopnea, PND or edema.   ROS: See HPI        Studies Reviewed: [name]    EKG Interpretation Date/Time:  Monday May 22 2023 15:49:28 EDT Ventricular Rate:  47 PR Interval:  162 QRS Duration:  80 QT Interval:  498 QTC Calculation: 440 R Axis:   59  Text Interpretation: Sinus bradycardia Confirmed by Eligha Bridegroom [phone]) on 05/22/2023 3:56:41 PM     Risk Assessment/Calculations:             Physical Exam:   VS:  BP 126/70   Pulse (!) 47   Ht 5\' 7"  (1.702 m)   Wt 158 lb (71.7 kg)   SpO2 99%   BMI 24.75 kg/m    Wt Readings from Last 3 Encounters:  05/22/23 158 lb (71.7 kg)  04/19/22 159 lb 9.6 oz (72.4 kg)  03/25/21 161 lb (73 kg)    GEN: Well nourished, well developed in no acute distress NECK: No JVD; No carotid bruits CARDIAC: RRR, no murmurs, rubs, gallops RESPIRATORY:  Clear to auscultation without rales, wheezing or rhonchi  ABDOMEN: Soft, non-tender, non-distended EXTREMITIES:  No edema; No deformity     ASSESSMENT AND PLAN: .    CAD: CT calcium score 05/02/2022 with CAC score 291 (84th percentile). She remains active with regular activities including pickleball and denies chest pain, dyspnea, or other symptoms concerning for angina.  No indication for further ischemic evaluation at this time. As noted below, goal for LDL is 70 or lower. Continue secondary prevention including regular physical activity and heart healthy, mostly  plant-based diet.  Hyperlipidemia LDL goal < 70: LDL 110 on 10/29/08. She has reduced rosuvastatin to 10 mg every other day due to intolerance of higher dose.  Lengthy discussion about additional agents for improved LDL lowering effect.  She verbalized interest in Leqvio due to a cousin who is taking it.  Margaretmary Dys, RPH reviewed her benefits and advised Leqvio would be very costly for her. Repatha and Praluent likely $45/month out of pocket. She is hesitant to try PCSK9i due to injecting herself. Would like to try ezetimibe 10 mg daily in addition to rosuvastatin 10 mg every other day.  We will recheck FLP in 2-3  months.   Hypertension: BP is well controlled.   Bradycardia: EKG reveals sinus bradycardia at 47 bpm. This is consistent with most recent EKG from 04/19/22. HR increases with walking around the exam room. She is having some dizziness/lightheadedness but no syncope. There is no evidence of conduction disease on EKG. As noted below, we will evaluate for additional causes of dizziness. Consider cardiac monitor in the future.   Dizziness: Recent fall 2/2 dizziness coming off a ladder.  Consideration given to multiple factors including bradycardia, dehydration/orthostasis, and carotid artery disease.  Her HR improves with walking and there is no evidence of conduction disease on EKG. we will hold off on cardiac monitor for now.  We will get carotid ultrasound to evaluate for carotid stenosis that may be contributing.  Will have her discontinue lisinopril-HCTZ and continue lisinopril 20 mg alone. I will see her back for soon follow-up.         Dispo: 2-3 months with me  Signed, Eligha Bridegroom, NP-C

## 2023-05-22 ENCOUNTER — Encounter: Payer: Self-pay | Admitting: Nurse Practitioner

## 2023-05-22 ENCOUNTER — Ambulatory Visit: Payer: Medicare Other | Attending: Nurse Practitioner | Admitting: Nurse Practitioner

## 2023-05-22 VITALS — BP 126/70 | HR 47 | Ht 67.0 in | Wt 158.0 lb

## 2023-05-22 DIAGNOSIS — I2510 Atherosclerotic heart disease of native coronary artery without angina pectoris: Secondary | ICD-10-CM | POA: Diagnosis not present

## 2023-05-22 DIAGNOSIS — R42 Dizziness and giddiness: Secondary | ICD-10-CM | POA: Diagnosis not present

## 2023-05-22 DIAGNOSIS — R001 Bradycardia, unspecified: Secondary | ICD-10-CM

## 2023-05-22 DIAGNOSIS — E785 Hyperlipidemia, unspecified: Secondary | ICD-10-CM

## 2023-05-22 DIAGNOSIS — I10 Essential (primary) hypertension: Secondary | ICD-10-CM

## 2023-05-22 MED ORDER — LISINOPRIL 20 MG PO TABS: 20.0000 mg | ORAL_TABLET | Freq: Every day | ORAL | 3 refills | Status: DC

## 2023-05-22 MED ORDER — EZETIMIBE 10 MG PO TABS: 10.0000 mg | ORAL_TABLET | Freq: Every day | ORAL | 3 refills | Status: DC

## 2023-05-22 NOTE — Patient Instructions (Signed)
Medication Instructions:   CHANGE Lisinopril one (1) tablet by mouth ( 20 mg) daily.   CHANGE Rosuvastatin one (1) tablet by mouth (10 mg ) every other day.   START Zetia one (1) tablet by mouth ( 10 mg ) daily.   *If you need a refill on your cardiac medications before your next appointment, please call your pharmacy*   Lab Work:  None ordered.  If you have labs (blood work) drawn today and your tests are completely normal, you will receive your results only by: MyChart Message (if you have MyChart) OR A paper copy in the mail If you have any lab test that is abnormal or we need to change your treatment, we will call you to review the results.   Testing/Procedures:  Your physician has requested that you have a carotid duplex. This test is an ultrasound of the carotid arteries in your neck. It looks at blood flow through these arteries that supply the brain with blood. Allow one hour for this exam. There are no restrictions or special instructions.MONDAY, AUGUST 5 @ 11:00 AM.     Follow-Up: At [facility], you and your health needs are our priority.  As part of our continuing mission to provide you with exceptional heart care, we have created designated Provider Care Teams.  These Care Teams include your primary Cardiologist (physician) and Advanced Practice Providers (APPs -  Physician Assistants and Nurse Practitioners) who all work together to provide you with the care you need, when you need it.  We recommend signing up for the patient portal called "MyChart".  Sign up information is provided on this After Visit Summary.  MyChart is used to connect with patients for Virtual Visits (Telemedicine).  Patients are able to view lab/test results, encounter notes, upcoming appointments, etc.  Non-urgent messages can be sent to your provider as well.   To learn more about what you can do with MyChart, go to [email or web address].    Your next appointment:   3  month(s)  Provider:   Eligha Bridegroom, NP

## 2023-05-29 ENCOUNTER — Ambulatory Visit (HOSPITAL_COMMUNITY)
Admission: RE | Admit: 2023-05-29 | Discharge: 2023-05-29 | Disposition: A | Discharge status: Home / Self Care | Payer: Medicare Other | Source: Ambulatory Visit | Attending: Cardiovascular Disease | Admitting: Cardiovascular Disease

## 2023-05-29 DIAGNOSIS — R001 Bradycardia, unspecified: Secondary | ICD-10-CM | POA: Insufficient documentation

## 2023-05-29 DIAGNOSIS — R42 Dizziness and giddiness: Secondary | ICD-10-CM | POA: Diagnosis not present

## 2023-07-22 IMAGING — US US ABDOMEN LIMITED
1 series · 14 of 25 positions shown · non-contrast
Comparison: Ultrasound dated August 26, 2021

CLINICAL DATA: Hepatic fibrosis, advanced fibrosis

EXAM:
ULTRASOUND ABDOMEN LIMITED RIGHT UPPER QUADRANT

[Series 1: us abdomen limited · 0.18mm/px · 14 of 53 slices shown]
[im 1/53]
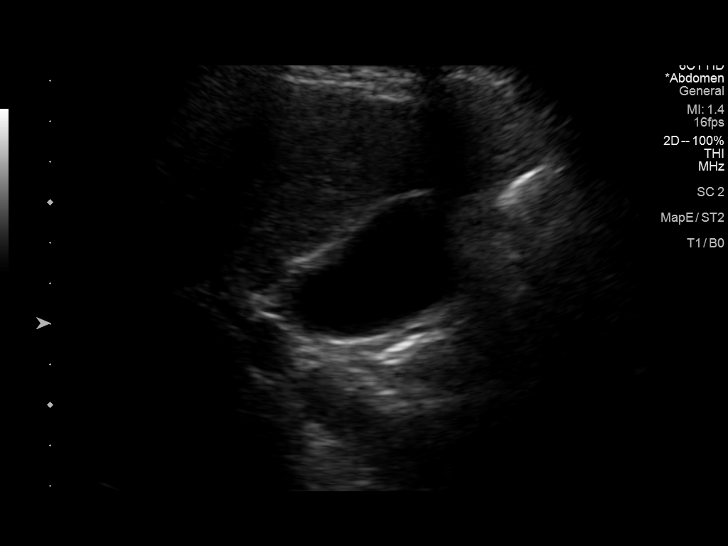
[im 5/53]
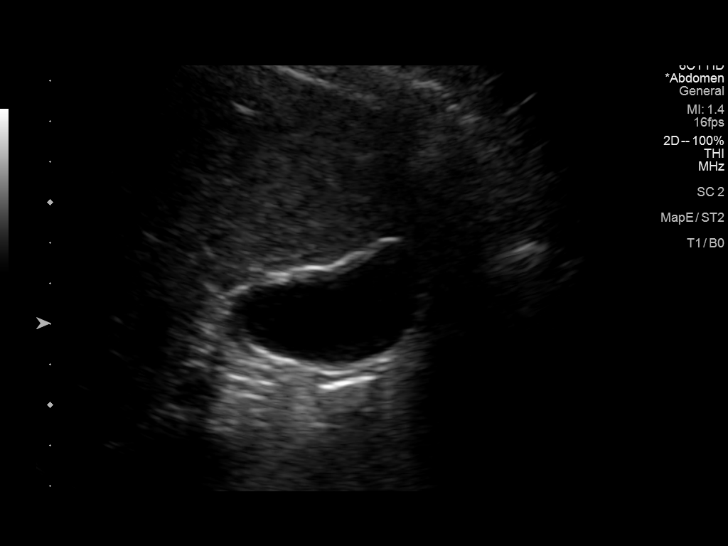
[im 9/53]
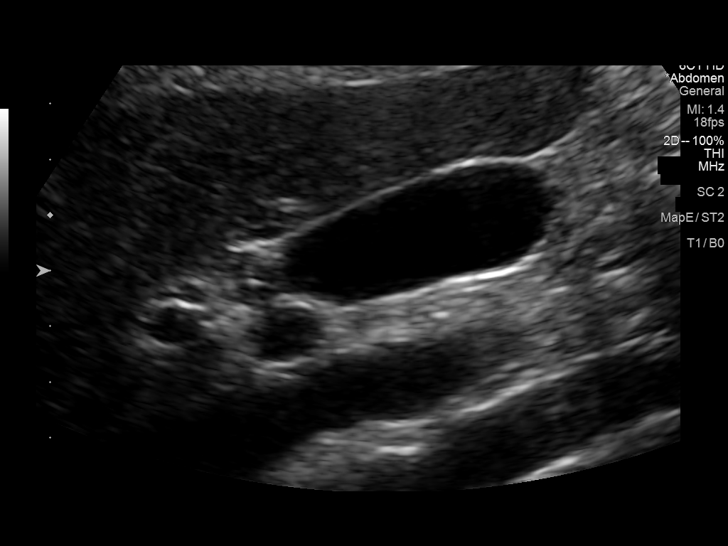
[im 14/53]
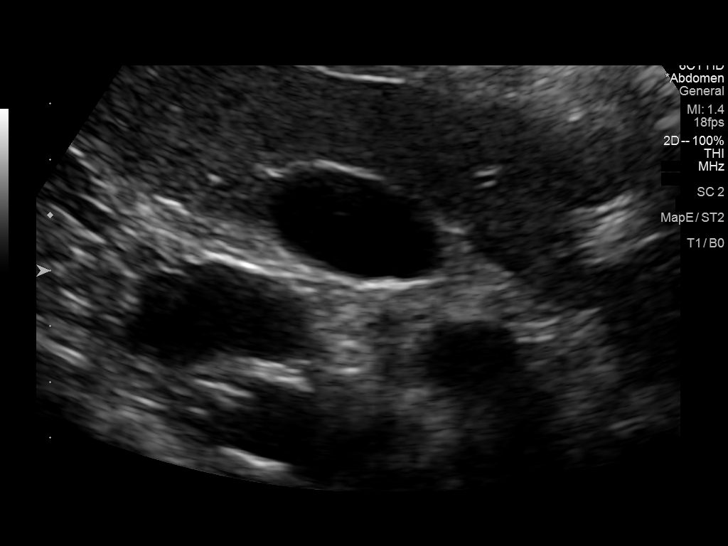
[im 18/53]
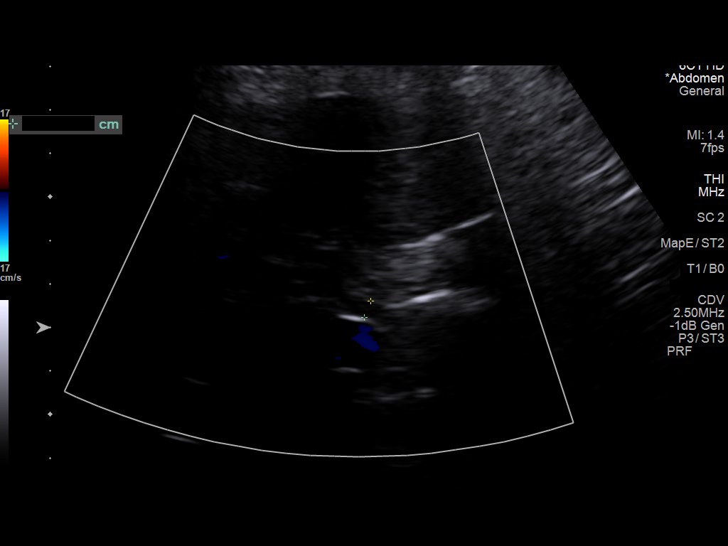
[im 20/53]
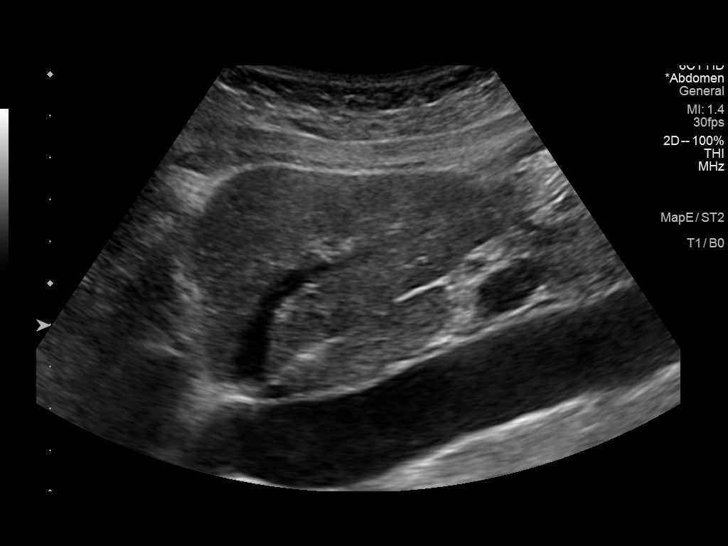
[im 24/53]
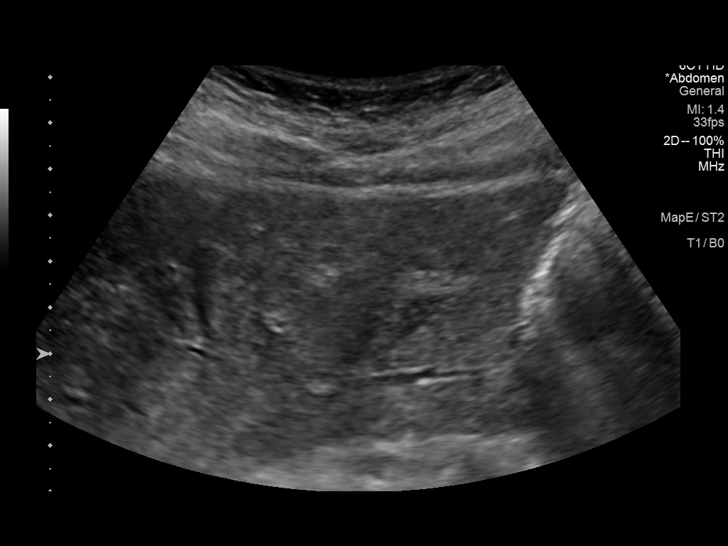
[im 29/53]
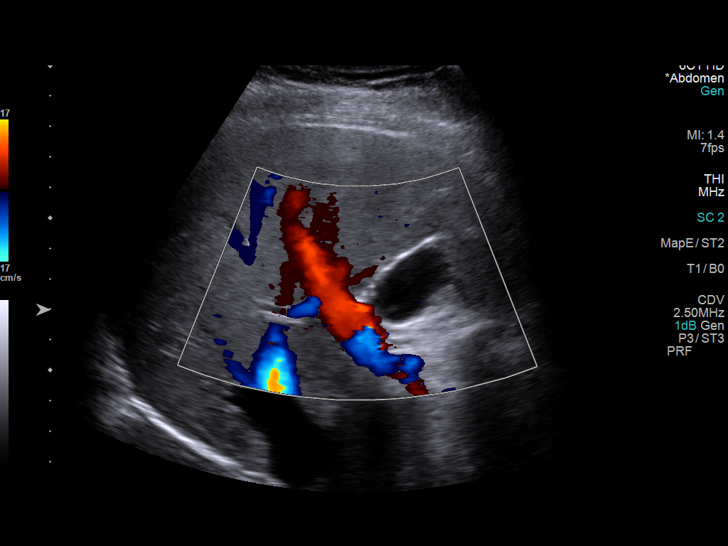
[im 33/53]
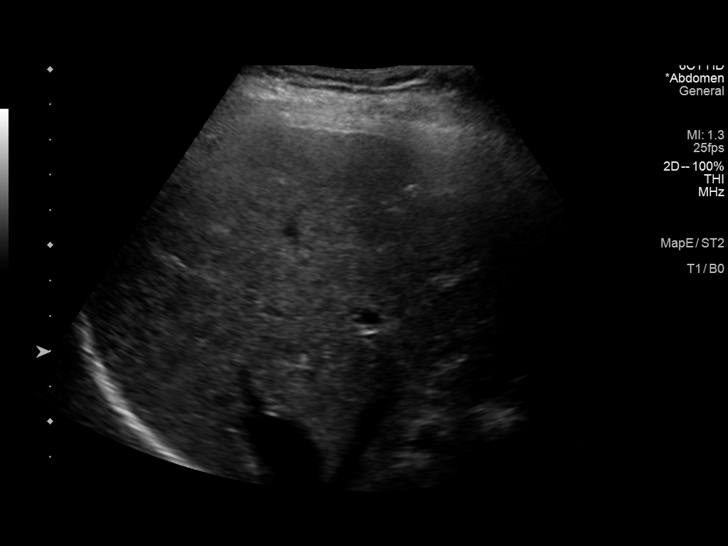
[im 35/53]
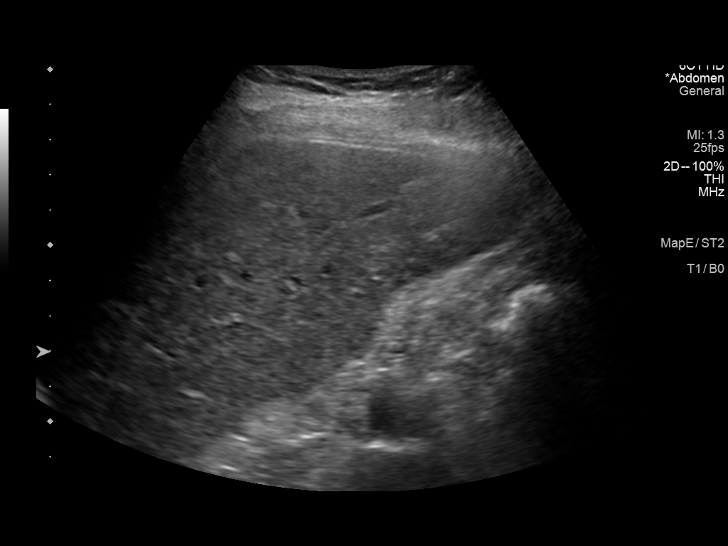
[im 40/53]
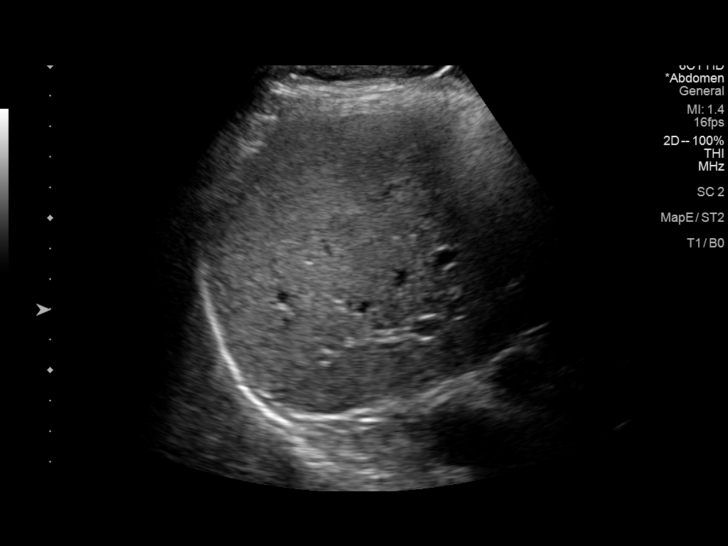
[im 44/53]
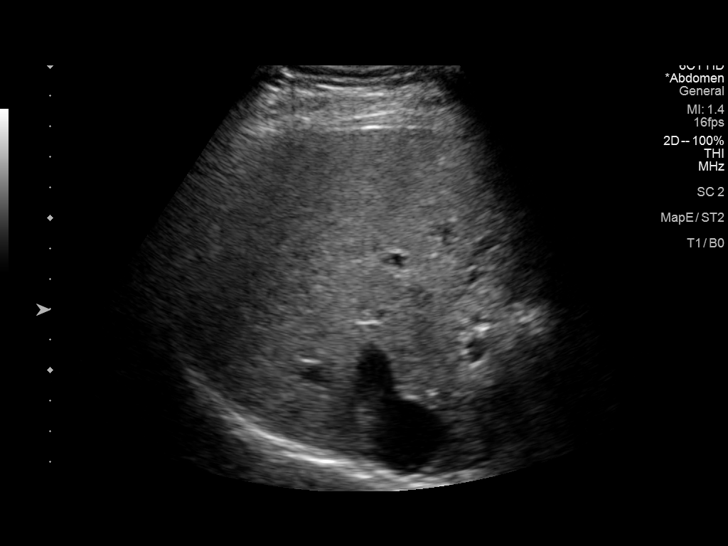
[im 48/53]
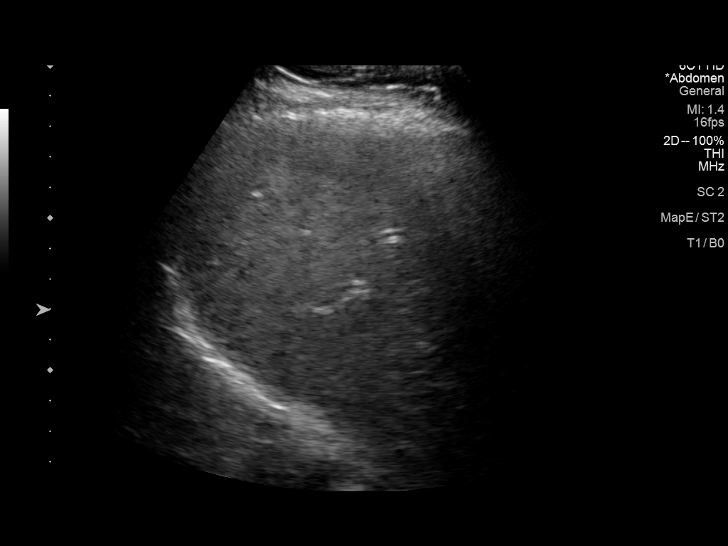
[im 53/53]
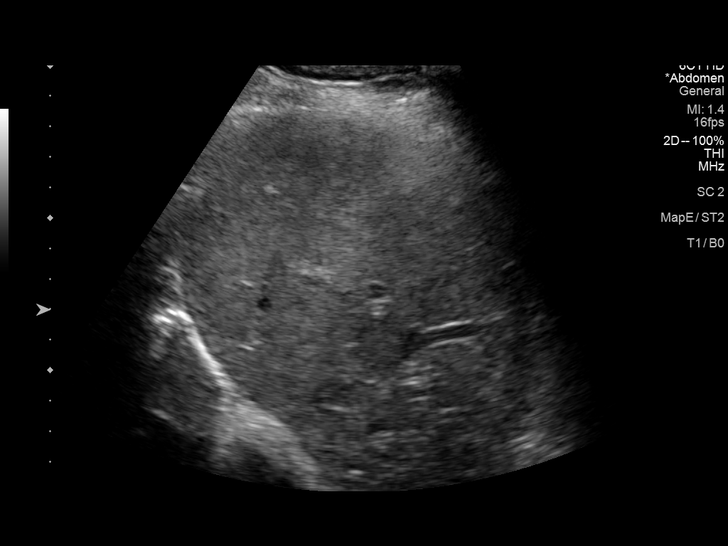

[14 of 25 positions shown; findings below may reference images not displayed]

FINDINGS: Gallbladder:

No gallstones or wall thickening visualized. No sonographic Murphy
sign noted by sonographer.

Common bile duct:

Diameter: 4 mm, normal

Liver:

No focal lesion identified. Heterogeneous parenchymal echogenicity
with mildly lobular contours. Portal vein is patent on color Doppler
imaging with normal direction of blood flow towards the liver.

Other: None.
IMPRESSION: No new focal lesion identified.

## 2023-07-26 NOTE — Progress Notes (Signed)
Cardiology Office Note:  .   Date:  07/28/2023  ID:  Amy, Dunlap 07-27-1951, MRN [id number] PCP: Amy Raider, MD  [facility] Providers Cardiologist:  None    Patient Profile: .      PMH: Bradycardia CAD Coronary artery calcium score 291 (84th percentile) 04/2022 Hyperlipidemia Orthostasis Hypertension Family hx CAD Aortic atherosclerosis Hepatitis C  Referred to cardiology for evaluation of bradycardia and seen by Dr. Elease Dunlap 10/2019.  She did not have symptoms of presyncope or syncope.  Heart rate increased to 95 bpm with step test, intact chronotropic response.  Advised to follow-up yearly for EKGs.  She returned 04/19/2022 with symptoms of orthostasis.  She had recently had lisinopril/HCTZ dose lowered.  Concerned about family history of CAD.  Advised to undergo CT calcium score completed on 05/02/22 and revealing coronary calcium score of 291 (84th percentile).  There was a lytic lesion identified on the T9 vertebral body, she was recommended to follow-up with PCP for this.  She was advised to start rosuvastatin 20 mg daily for elevated LDL.  She called back 07/2022 to report muscle and joint pain from Crestor.  Agreeable to try 10 mg daily.   Seen by me on 05/22/23 for follow-up. Feeling more fatigued over the past few months, particularly more so than a year ago. Remains active playing pickleball, walking, and doing yoga. Sleeps about 12 hours per night. Recent episodes of dizziness while coming down off a ladder, she fell and hurt her wrist. No syncope. On a separate occasion, she was walking and lost her balance. Her cousin kept her from falling. Her mother passed away at her age, thought to be due to heart attack which concerns her. She denied chest pain, shortness of breath, palpitations, orthopnea, PND or edema. Her HR improves with walking and there is no evidence of conduction disease on EKG. She preferred that we hold off on cardiac monitor for now.  We discontinued  lisinopril-HCTZ and continued lisinopril 20 mg alone. Carotid duplex completed 05/30/23 revealed 1-39% bilateral stenosis, not likely contributing to symptoms.        History of Present Illness: .   Amy Dunlap is a very pleasant 72 y.o. female who is here today for follow-up. Is overall feeling well. She has a history of migraines for which she has been taking Nurtec as needed. Feels like it is helping a great deal. She has not had any further episodes of dizziness. Is not sure if improvement coincides with starting Nurtec. Heart is 57 bpm, had previously been recorded in the 40s. She denies pre-syncope, syncope. Reports good energy levels and walking on a consistent basis for exercise. No shortness of breath or chest pain. No issues with blood pressure. Notes some visual changes but just had check up with eye doctor and received a good report. She has not been keeping a food diary to track potential migraine triggers, but has noticed that bread makes them feel unwell, leading her suspect a gluten allergy. No chest pain, shortness of breath, palpitations, orthopnea, PND or edema.   ROS: See HPI        Studies Reviewed: .         Risk Assessment/Calculations:             Physical Exam:   VS:  BP 128/62   Pulse (!) 57   Ht 5\' 7"  (1.702 m)   Wt 154 lb 9.6 oz (70.1 kg)   SpO2 98%   BMI 24.21  kg/m    Wt Readings from Last 3 Encounters:  07/28/23 154 lb 9.6 oz (70.1 kg)  05/22/23 158 lb (71.7 kg)  04/19/22 159 lb 9.6 oz (72.4 kg)    GEN: Well nourished, well developed in no acute distress NECK: No JVD; No carotid bruits CARDIAC: RRR, no murmurs, rubs, gallops RESPIRATORY:  Clear to auscultation without rales, wheezing or rhonchi  ABDOMEN: Soft, non-tender, non-distended EXTREMITIES:  No edema; No deformity     ASSESSMENT AND PLAN: .    Dizziness: Seen 2 months ago for dizziness. Consideration given to multiple factors including bradycardia, dehydration/orthostasis, and carotid  artery disease. Carotid duplex 05/30/23 revealed 1-39% bilateral stenosis.  HR is 57 bpm today.  She is walking for exercise with no presycnope or syncope.  She is having improvement in migraine symptoms on Nurtec, consideration given to migraines causing increased dizziness.  She will notify us if she has further concerns with dizziness.  CAD without angina: CT calcium score 05/02/2022 with CAC score 291 (84th percentile). She denies chest pain, dyspnea, or other symptoms concerning for angina.  No indication for further ischemic evaluation at this time. Continue secondary prevention including regular physical activity and heart healthy, mostly plant-based diet.  Hyperlipidemia LDL goal < 70: LDL 110 on 10/29/1094.  She has been tolerating rosuvastatin 10 mg every other day and ezetimibe 10 mg daily.  We will recheck fasting lipid and ALT today.  Hypertension: BP is well controlled. No further dizziness recently. No medication changes today.    Bradycardia: HR is 57 bpm today. There is no evidence of conduction disease on recent EKG. she is exercising without symptoms of dizziness, presyncope, syncope.  She is not on AV nodal blocking agent.  No additional testing indicated at this time.        Dispo: 1 year with Dr. Elease Dunlap or me  Signed, Amy Bridegroom, NP-C

## 2023-07-28 ENCOUNTER — Ambulatory Visit: Payer: Medicare Other | Attending: Nurse Practitioner | Admitting: Nurse Practitioner

## 2023-07-28 ENCOUNTER — Encounter: Payer: Self-pay | Admitting: Nurse Practitioner

## 2023-07-28 VITALS — BP 128/62 | HR 57 | Ht 67.0 in | Wt 154.6 lb

## 2023-07-28 DIAGNOSIS — R42 Dizziness and giddiness: Secondary | ICD-10-CM | POA: Diagnosis not present

## 2023-07-28 DIAGNOSIS — I2510 Atherosclerotic heart disease of native coronary artery without angina pectoris: Secondary | ICD-10-CM

## 2023-07-28 DIAGNOSIS — R001 Bradycardia, unspecified: Secondary | ICD-10-CM | POA: Diagnosis not present

## 2023-07-28 DIAGNOSIS — I10 Essential (primary) hypertension: Secondary | ICD-10-CM

## 2023-07-28 DIAGNOSIS — E785 Hyperlipidemia, unspecified: Secondary | ICD-10-CM | POA: Diagnosis not present

## 2023-07-28 NOTE — Patient Instructions (Addendum)
Medication Instructions:   Your physician recommends that you continue on your current medications as directed. Please refer to the Current Medication list given to you today.   *If you need a refill on your cardiac medications before your next appointment, please call your pharmacy*   Lab Work:  TODAY!!!! CMET/LIPID  If you have labs (blood work) drawn today and your tests are completely normal, you will receive your results only by: MyChart Message (if you have MyChart) OR A paper copy in the mail If you have any lab test that is abnormal or we need to change your treatment, we will call you to review the results.   Testing/Procedures:  None ordered.   Follow-Up: At [facility], you and your health needs are our priority.  As part of our continuing mission to provide you with exceptional heart care, we have created designated Provider Care Teams.  These Care Teams include your primary Cardiologist (physician) and Advanced Practice Providers (APPs -  Physician Assistants and Nurse Practitioners) who all work together to provide you with the care you need, when you need it.  We recommend signing up for the patient portal called "MyChart".  Sign up information is provided on this After Visit Summary.  MyChart is used to connect with patients for Virtual Visits (Telemedicine).  Patients are able to view lab/test results, encounter notes, upcoming appointments, etc.  Non-urgent messages can be sent to your provider as well.   To learn more about what you can do with MyChart, go to [email or web address].    Your next appointment:   1 year(s)  Provider:   Leodis Sias, MD or Lebron Conners    Other Instructions  Your physician wants you to follow-up in: 1 year.  You will receive a reminder letter in the mail two months in advance. If you don't receive a letter, please call our office to schedule the follow-up appointment.

## 2023-07-29 LAB — LIPID PANEL
Chol/HDL Ratio: 1.9 {ratio} (ref 0.0–4.4)
Cholesterol, Total: 131 mg/dL (ref 100–199)
HDL: 70 mg/dL (ref >39)
LDL Chol Calc (NIH): 48 mg/dL (ref 0–99)
Triglycerides: 63 mg/dL (ref 0–149)
VLDL Cholesterol Cal: 13 mg/dL (ref 5–40)

## 2023-07-29 LAB — COMPREHENSIVE METABOLIC PANEL
ALT: 15 [IU]/L (ref 0–32)
AST: 22 [IU]/L (ref 0–40)
Albumin: 4.9 g/dL — ABNORMAL HIGH (ref 3.8–4.8)
Alkaline Phosphatase: 65 [IU]/L (ref 44–121)
BUN/Creatinine Ratio: 17 (ref 12–28)
BUN: 24 mg/dL (ref 8–27)
Bilirubin Total: 0.3 mg/dL (ref 0.0–1.2)
CO2: 25 mmol/L (ref 20–29)
Calcium: 9.8 mg/dL (ref 8.7–10.3)
Chloride: 98 mmol/L (ref 96–106)
Creatinine, Ser: 1.39 mg/dL — ABNORMAL HIGH (ref 0.57–1.00)
Globulin, Total: 2.2 g/dL (ref 1.5–4.5)
Glucose: 96 mg/dL (ref 70–99)
Potassium: 4.7 mmol/L (ref 3.5–5.2)
Sodium: 138 mmol/L (ref 134–144)
Total Protein: 7.1 g/dL (ref 6.0–8.5)
eGFR: 40 mL/min/{1.73_m2} — ABNORMAL LOW (ref >59)

## 2023-08-31 ENCOUNTER — Other Ambulatory Visit: Payer: Self-pay | Admitting: Nurse Practitioner

## 2023-08-31 DIAGNOSIS — K7402 Hepatic fibrosis, advanced fibrosis: Secondary | ICD-10-CM

## 2023-09-11 ENCOUNTER — Ambulatory Visit
Admission: RE | Admit: 2023-09-11 | Discharge: 2023-09-11 | Disposition: A | Discharge status: Home / Self Care | Payer: Medicare Other | Source: Ambulatory Visit | Attending: Nurse Practitioner

## 2023-09-11 DIAGNOSIS — K7402 Hepatic fibrosis, advanced fibrosis: Secondary | ICD-10-CM

## 2023-12-20 ENCOUNTER — Ambulatory Visit (INDEPENDENT_AMBULATORY_CARE_PROVIDER_SITE_OTHER): Payer: Medicare Other | Admitting: Orthopedic Surgery

## 2023-12-20 ENCOUNTER — Other Ambulatory Visit (INDEPENDENT_AMBULATORY_CARE_PROVIDER_SITE_OTHER): Payer: Medicare Other

## 2023-12-20 ENCOUNTER — Encounter: Payer: Self-pay | Admitting: Orthopedic Surgery

## 2023-12-20 DIAGNOSIS — M79671 Pain in right foot: Secondary | ICD-10-CM

## 2023-12-20 DIAGNOSIS — M5441 Lumbago with sciatica, right side: Secondary | ICD-10-CM | POA: Diagnosis not present

## 2023-12-20 MED ORDER — PREDNISONE 5 MG (21) PO TBPK: ORAL_TABLET | ORAL | 0 refills | Status: DC

## 2023-12-20 MED ORDER — METHOCARBAMOL 500 MG PO TABS: ORAL_TABLET | ORAL | 0 refills | Status: DC

## 2023-12-20 NOTE — Progress Notes (Signed)
 Office Visit Note   Patient: Amy Dunlap           Date of Birth: 1951/06/27           MRN: [id number] Visit Date: 12/20/2023 Requested by: Lupita Raider, MD [address] [place],  [place] [address] PCP: Lupita Raider, MD  Subjective: Chief Complaint  Patient presents with   Right Foot - Pain   Lower Back - Pain    HPI: Amy Dunlap is a 73 y.o. female who presents to the office reporting low back pain and right heel pain which started 1 week ago.  She has been soaking and massaging with some relief.  Using a heating pad on her back.  Pain does wake her from sleep at times.  Jabbing type pain.  She is on cholesterol medications and feels like that may be contributing to her overall joint complaints.  No history of surgery on her back.  Does take ibuprofen twice a day which helps some..                ROS: All systems reviewed are negative as they relate to the chief complaint within the history of present illness.  Patient denies fevers or chills.  Assessment & Plan: Visit Diagnoses:  1. Pain in right foot   2. Right-sided low back pain with right-sided sciatica, unspecified chronicity     Plan: Impression is back and heel pain.  The heel pain may be related to plantar fasciitis or the back.  She has pretty high arches.  No nerve root tension signs today.  Degenerative changes are present in the lumbar spine.  Plan is Medrol Dosepak 6-day course with Robaxin.  She will let us know through MyChart in 4 weeks whether or not her symptoms are improving.  We could consider imaging at that time.  I do want her to try to keep her back as limber as possible with stretching and strengthening exercises.  Follow-Up Instructions: No follow-ups on file.   Orders:  Orders Placed This Encounter  Procedures   XR Lumbar Spine 2-3 Views   XR Foot Complete Right   Meds ordered this encounter  Medications   predniSONE (STERAPRED UNI-PAK 21 TAB) 5 MG (21) TBPK tablet    Sig:  Take dose pak as directed    Dispense:  21 tablet    Refill:  0   methocarbamol (ROBAXIN) 500 MG tablet    Sig: 1 po q 8 hrs prn spasm    Dispense:  30 tablet    Refill:  0      Procedures: No procedures performed   Clinical Data: No additional findings.  Objective: Vital Signs: There were no vitals taken for this visit.  Physical Exam:  Constitutional: Patient appears well-developed HEENT:  Head: Normocephalic Eyes:EOM are normal Neck: Normal range of motion Cardiovascular: Normal rate Pulmonary/chest: Effort normal Neurologic: Patient is alert Skin: Skin is warm Psychiatric: Patient has normal mood and affect  Ortho Exam: Ortho exam demonstrates normal gait alignment.  No nerve root tension signs.  Patient has 5 out of 5 ankle dorsiflexion plantarflexion quad hamstring strength.  No masses lymphadenopathy or skin changes noted in that right heel or back region.  No trochanteric tenderness is present.  Pedal pulses palpable.  Does have a little bit of heel pain which is more plantar and on the sides as opposed to at the Achilles tendon attachment.  Has palpable intact nontender anterior to posterior to  peroneal Achilles tendons. On right ankle exam the patient has normal gait palpable pedal pulses and intact sensation on the dorsal and plantar aspect of the right foot.  Patient has bilateral symmetric tibiotalar subtalar and transverse tarsal range of motion.  Patient has palpable intact and nontender anterior tib posterior tib peroneal and Achilles tendons with 5 out of 5 ankle dorsiflexion plantarflexion inversion and eversion strength.  On the right-hand side patient has appropriate heel inversion when standing up on the metatarsal heads.  No masses lymphadenopathy or skin changes in the right ankle region.    Specialty Comments:  No specialty comments available.  Imaging: XR Foot Complete Right Result Date: 12/20/2023 AP lateral and oblique radiographs right foot  reviewed.  No acute fracture or dislocation.  Tarsometatarsal alignment intact.  No degenerative arthritis in the midfoot or MTP joints.[name] is high.  XR Lumbar Spine 2-3 Views Result Date: 12/20/2023 AP lateral radiographs lumbar spine reviewed.  Some calcification of the aorta is present.  No spondylolisthesis or compression fractures.  Moderate degenerative change present at L5-S1.  Remainder of the disc spaces appear reasonably well-preserved.  Mild facet arthritis present in the lower lumbar regions.    PMFS History: There are no active problems to display for this patient.  Past Medical History:  Diagnosis Date   Anxiety    Arthritis    Heart murmur    ?   History of kidney stones    Hypertension     Family History  Problem Relation Age of Onset   Hypertension Mother    Heart attack Mother    Cancer - Other Sister     Past Surgical History:  Procedure Laterality Date   COLONOSCOPY     Hx: of   DILATION AND CURETTAGE OF UTERUS     HARDWARE REMOVAL Right 02/04/2014   Procedure: HARDWARE REMOVAL;  Surgeon: Cammy Copa, MD;  Location: [facility] OR;  Service: Orthopedics;  Laterality: Right;  REMOVAL OF HARDWARE RIGHT SHOULDER.   KNEE ARTHROSCOPY     Hx: of right knee   ORIF HUMERUS FRACTURE Right 12/03/2013   Procedure: OPEN REDUCTION INTERNAL FIXATION (ORIF) PROXIMAL HUMERUS FRACTURE;  Surgeon: Cammy Copa, MD;  Location: [facility] OR;  Service: Orthopedics;  Laterality: Right;   TONSILLECTOMY     Social History   Occupational History   Not on file  Tobacco Use   Smoking status: Former    Current packs/day: 0.00    Average packs/day: 1 pack/day for 25.0 years (25.0 ttl pk-yrs)    Types: Cigarettes    Start date: 01/24/1986    Quit date: 01/25/2011    Years since quitting: 12.9   Smokeless tobacco: Never   Tobacco comments:    Quit smoking cigarettes in 2014  Substance and Sexual Activity   Alcohol use: Yes    Comment: 1 beer a week   Drug use: No   Sexual  activity: Not on file

## 2023-12-21 ENCOUNTER — Encounter: Payer: Self-pay | Admitting: Orthopedic Surgery

## 2024-01-01 ENCOUNTER — Other Ambulatory Visit: Payer: Self-pay | Admitting: Nurse Practitioner

## 2024-01-18 ENCOUNTER — Other Ambulatory Visit: Payer: Self-pay | Admitting: Orthopedic Surgery

## 2024-03-05 ENCOUNTER — Other Ambulatory Visit: Payer: Self-pay | Admitting: Nurse Practitioner

## 2024-03-05 DIAGNOSIS — K7402 Hepatic fibrosis, advanced fibrosis: Secondary | ICD-10-CM

## 2024-03-11 ENCOUNTER — Ambulatory Visit
Admission: RE | Admit: 2024-03-11 | Discharge: 2024-03-11 | Disposition: A | Discharge status: Home / Self Care | Source: Ambulatory Visit | Attending: Nurse Practitioner | Admitting: Nurse Practitioner

## 2024-03-11 DIAGNOSIS — K7402 Hepatic fibrosis, advanced fibrosis: Secondary | ICD-10-CM

## 2024-08-26 ENCOUNTER — Encounter: Payer: Self-pay | Admitting: Radiology

## 2024-09-05 ENCOUNTER — Ambulatory Visit (HOSPITAL_BASED_OUTPATIENT_CLINIC_OR_DEPARTMENT_OTHER): Admitting: Nurse Practitioner

## 2024-09-05 ENCOUNTER — Encounter (HOSPITAL_BASED_OUTPATIENT_CLINIC_OR_DEPARTMENT_OTHER): Payer: Self-pay | Admitting: Nurse Practitioner

## 2024-09-05 VITALS — BP 128/82 | HR 49 | Ht 66.5 in | Wt 160.3 lb

## 2024-09-05 DIAGNOSIS — R001 Bradycardia, unspecified: Secondary | ICD-10-CM

## 2024-09-05 DIAGNOSIS — I10 Essential (primary) hypertension: Secondary | ICD-10-CM | POA: Diagnosis not present

## 2024-09-05 DIAGNOSIS — E785 Hyperlipidemia, unspecified: Secondary | ICD-10-CM | POA: Diagnosis not present

## 2024-09-05 DIAGNOSIS — I2510 Atherosclerotic heart disease of native coronary artery without angina pectoris: Secondary | ICD-10-CM | POA: Diagnosis not present

## 2024-09-05 DIAGNOSIS — I872 Venous insufficiency (chronic) (peripheral): Secondary | ICD-10-CM

## 2024-09-05 DIAGNOSIS — Z131 Encounter for screening for diabetes mellitus: Secondary | ICD-10-CM

## 2024-09-05 DIAGNOSIS — G629 Polyneuropathy, unspecified: Secondary | ICD-10-CM

## 2024-09-05 LAB — COMPREHENSIVE METABOLIC PANEL WITH GFR
ALT: 11 IU/L (ref 0–32)
AST: 17 IU/L (ref 0–40)
Albumin: 4.7 g/dL (ref 3.8–4.8)
Alkaline Phosphatase: 64 IU/L (ref 49–135)
BUN/Creatinine Ratio: 18 (ref 12–28)
BUN: 26 mg/dL (ref 8–27)
Bilirubin Total: 0.4 mg/dL (ref 0.0–1.2)
CO2: 26 mmol/L (ref 20–29)
Calcium: 9.6 mg/dL (ref 8.7–10.3)
Chloride: 99 mmol/L (ref 96–106)
Creatinine, Ser: 1.42 mg/dL — ABNORMAL HIGH (ref 0.57–1.00)
Globulin, Total: 2.3 g/dL (ref 1.5–4.5)
Glucose: 100 mg/dL — ABNORMAL HIGH (ref 70–99)
Potassium: 4.3 mmol/L (ref 3.5–5.2)
Sodium: 138 mmol/L (ref 134–144)
Total Protein: 7 g/dL (ref 6.0–8.5)
eGFR: 39 mL/min/1.73 — ABNORMAL LOW (ref >59)

## 2024-09-05 LAB — LIPID PANEL
Chol/HDL Ratio: 1.9 ratio (ref 0.0–4.4)
Cholesterol, Total: 141 mg/dL (ref 100–199)
HDL: 73 mg/dL (ref >39)
LDL Chol Calc (NIH): 56 mg/dL (ref 0–99)
Triglycerides: 56 mg/dL (ref 0–149)
VLDL Cholesterol Cal: 12 mg/dL (ref 5–40)

## 2024-09-05 LAB — HEMOGLOBIN A1C
Est. average glucose Bld gHb Est-mCnc: 123 mg/dL
Hgb A1c MFr Bld: 5.9 % — ABNORMAL HIGH (ref 4.8–5.6)

## 2024-09-05 NOTE — Patient Instructions (Signed)
 Medication Instructions:   Your physician recommends that you continue on your current medications as directed. Please refer to the Current Medication list given to you today.   *If you need a refill on your cardiac medications before your next appointment, please call your pharmacy*  Lab Work:  TODAY!!!! A1C/LIPID/CMET  If you have labs (blood work) drawn today and your tests are completely normal, you will receive your results only by: MyChart Message (if you have MyChart) OR A paper copy in the mail If you have any lab test that is abnormal or we need to change your treatment, we will call you to review the results.  Testing/Procedures:  None ordered.  Follow-Up: At [facility], you and your health needs are our priority.  As part of our continuing mission to provide you with exceptional heart care, our providers are all part of one team.  This team includes your primary Cardiologist (physician) and Advanced Practice Providers or APPs (Physician Assistants and Nurse Practitioners) who all work together to provide you with the care you need, when you need it.  Your next appointment:   1 year(s)  Provider:   Rosaline Bane, NP    We recommend signing up for the patient portal called MyChart.  Sign up information is provided on this After Visit Summary.  MyChart is used to connect with patients for Virtual Visits (Telemedicine).  Patients are able to view lab/test results, encounter notes, upcoming appointments, etc.  Non-urgent messages can be sent to your provider as well.   To learn more about what you can do with MyChart, go to [email or web address].   Other Instructions  Your physician wants you to follow-up in: 1 year.  You will receive a reminder letter in the mail two months in advance. If you don't receive a letter, please call our office to schedule the follow-up appointment.

## 2024-09-05 NOTE — Progress Notes (Signed)
 Cardiology Office Note:  .   Date:  09/07/2024  ID:  Amy Dunlap, Amy Dunlap Jul 09, 1951, MRN [id number] PCP: Loreli Kins, MD  [facility] Providers Cardiologist:  None Cardiology APP:  Percy Rosaline HERO, NP    Patient Profile: .      PMH: Bradycardia CAD Coronary artery calcium  score 291 (84th percentile) 04/2022 Hyperlipidemia Orthostasis Hypertension Family hx CAD Aortic atherosclerosis Hepatitis C  Referred to cardiology for evaluation of bradycardia and seen by Dr. Alveta 10/2019.  She did not have symptoms of presyncope or syncope.  Heart rate increased to 95 bpm with step test, intact chronotropic response.  Advised to follow-up yearly for EKGs.  She returned 04/19/2022 with symptoms of orthostasis.  She had recently had lisinopril /HCTZ dose lowered.  Concerned about family history of CAD.  Advised to undergo CT calcium  score completed on 05/02/22 and revealing coronary calcium  score of 291 (84th percentile).  There was a lytic lesion identified on the T9 vertebral body, she was recommended to follow-up with PCP for this.  She was advised to start rosuvastatin  20 mg daily for elevated LDL.  She called back 07/2022 to report muscle and joint pain from Crestor .  Agreeable to try 10 mg daily.   Seen by me on 05/22/23 for follow-up. Feeling more fatigued, particularly more so than a year prior. Remains active playing pickleball, walking, and doing yoga. Sleeps about 12 hours per night. Recent episodes of dizziness while coming down off a ladder, she fell and hurt her wrist. No syncope. On a separate occasion, she was walking and lost her balance. Her cousin kept her from falling. Her mother passed away at her age, thought to be due to heart attack which concerns her. She denied chest pain, shortness of breath, palpitations, orthopnea, PND or edema. Her HR improves with walking and there is no evidence of conduction disease on EKG. She preferred that we hold off on cardiac monitor for  now.  We discontinued lisinopril -HCTZ and continued lisinopril  20 mg alone. Carotid duplex completed 05/30/23 revealed 1-39% bilateral stenosis, not likely contributing to symptoms.   Seen in follow-up by me on 07/28/23, overall feeling well. History of migraines for which she has been taking Nurtec as needed. Feels like it is helping a great deal. No further episodes of dizziness. Is not sure if improvement coincides with starting Nurtec. Heart is 57 bpm, had previously been recorded in the 40s. No pre-syncope, syncope. Reports good energy levels and walking on a consistent basis for exercise. No shortness of breath or chest pain. No issues with blood pressure. Notes some visual changes but just had check up with eye doctor and received a good report. She has not been keeping a food diary to track potential migraine triggers, but has noticed that bread makes them feel unwell, leading her suspect a gluten allergy. No chest pain, shortness of breath, palpitations, orthopnea, PND or edema. Lipids were well controlled and no further testing was indicated.        History of Present Illness: .   Discussed the use of AI scribe software for clinical note transcription with the patient, who gave verbal consent to proceed.  History of Present Illness Amy Dunlap is a 73 year old female who presents for follow-up of CAD. She has concerns about neuropathy and leg pain. She experiences burning and painful sensations in her feet, suspected to be neuropathy, worsening over the past couple of years. She has significant leg pain extending to her ankles, impacting  her mobility and ability to exercise. She has been under the care of a vein specialist for these issues and has undergone blood flow scanning. Family history of diabetes, with her brother recently diagnosed, and a sister with heart failure. She has high cholesterol and has been inconsistent with her medication due to joint pain. She is concerned about her current  cholesterol levels. She experiences occasional shortness of breath, particularly when walking uphill, but denies chest pain or significant shortness of breath at rest. She experiences lightheadedness when standing quickly but has not experienced syncope.  She had an echocardiogram in 2010.   ROS: See HPI        Studies Reviewed: SABRA   EKG Interpretation Date/Time:  Thursday September 05 2024 15:23:37 EST Ventricular Rate:  49 PR Interval:  160 QRS Duration:  80 QT Interval:  456 QTC Calculation: 411 R Axis:   72  Text Interpretation: Sinus bradycardia When compared with ECG of 22-May-2023 15:49, No significant change was found Confirmed by Percy Browning [phone]) on 09/07/2024 8:22:56 AM     Risk Assessment/Calculations:          Physical Exam:   VS:  BP 128/82 (BP Location: Left Arm, Patient Position: Sitting, Cuff Size: Normal)   Pulse (!) 49   Ht 5' 6.5 (1.689 m)   Wt 160 lb 4.8 oz (72.7 kg)   SpO2 95%   BMI 25.49 kg/m    Wt Readings from Last 3 Encounters:  09/05/24 160 lb 4.8 oz (72.7 kg)  07/28/23 154 lb 9.6 oz (70.1 kg)  05/22/23 158 lb (71.7 kg)    GEN: Well nourished, well developed in no acute distress NECK: No JVD; No carotid bruits CARDIAC: RRR, no murmurs, rubs, gallops RESPIRATORY:  Clear to auscultation without rales, wheezing or rhonchi  ABDOMEN: Soft, non-tender, non-distended EXTREMITIES:  No edema; No deformity     ASSESSMENT AND PLAN: .     Assessment & Plan Chronic venous insufficiency  Neuropathy  Chronic venous insufficiency managed by vein specialist. She also reports burning and discomfort in her legs and feet.  She reports prior imaging which did not reveal concern for obstruction.  Symptoms are exacerbated by the lack of compression stockings. - Continue management per vein specialist vein treatments as scheduled and encourage the use of compression stockings - Aerobic therapy or other nonweightbearing exercise encouraged - We will  recheck A1c for diabetes screening  CAD without angina Hyperlipidemia LDL Goal < 70 CT calcium  score 05/02/2022 with CAC score 291 (84th percentile). She denies chest pain, dyspnea, or other symptoms concerning for angina.  No indication for further ischemic evaluation at this time.  Most recent lipid panel 07/28/2023 with total cholesterol 131, LDL-C 48. Mild statin intolerance due to joint pain but is tolerating alternating rosuvastatin  and ezetimibe  every other day.  Alternative non-statin therapies discussed.  -Heart healthy diet limiting processed food, saturated fat, sugar, and other simple carbohydrates emphasized -Aim to be physically active every day as well as at least 150 minutes of moderate intensity exercise each week - Recheck lipid panel today for surveillance  - Consider nonstatin therapy if LDL above goal   Bradycardia   EKG reveals sinus bradycardia at 49 bpm.  She has history of low heart rate without syncope or significant dizziness. Family history of heart failure.  She is not on AV nodal blocking agent.  Red flag symptoms reviewed.   - Consider an echocardiogram for evaluation of heart and valve function - Report concerning symptoms  including fatigue, presyncope, syncope, dizziness  Hypertension BP is well controlled. No further dizziness recently. No medication changes today.   - Continue lisinopril          Dispo: 1 year with me  Signed, Rosaline Bane, NP-C

## 2024-09-06 ENCOUNTER — Other Ambulatory Visit (HOSPITAL_BASED_OUTPATIENT_CLINIC_OR_DEPARTMENT_OTHER): Payer: Self-pay | Admitting: *Deleted

## 2024-09-06 ENCOUNTER — Ambulatory Visit (HOSPITAL_BASED_OUTPATIENT_CLINIC_OR_DEPARTMENT_OTHER): Payer: Self-pay | Admitting: Nurse Practitioner

## 2024-09-06 DIAGNOSIS — Z79899 Other long term (current) drug therapy: Secondary | ICD-10-CM

## 2024-09-06 DIAGNOSIS — R748 Abnormal levels of other serum enzymes: Secondary | ICD-10-CM

## 2024-09-07 ENCOUNTER — Encounter (HOSPITAL_BASED_OUTPATIENT_CLINIC_OR_DEPARTMENT_OTHER): Payer: Self-pay | Admitting: Nurse Practitioner

## 2024-09-26 ENCOUNTER — Ambulatory Visit (HOSPITAL_BASED_OUTPATIENT_CLINIC_OR_DEPARTMENT_OTHER): Payer: Self-pay | Admitting: Nurse Practitioner

## 2024-09-26 LAB — BASIC METABOLIC PANEL WITH GFR
BUN/Creatinine Ratio: 19 (ref 12–28)
BUN: 27 mg/dL (ref 8–27)
CO2: 25 mmol/L (ref 20–29)
Calcium: 9.7 mg/dL (ref 8.7–10.3)
Chloride: 101 mmol/L (ref 96–106)
Creatinine, Ser: 1.4 mg/dL — ABNORMAL HIGH (ref 0.57–1.00)
Glucose: 89 mg/dL (ref 70–99)
Potassium: 4.6 mmol/L (ref 3.5–5.2)
Sodium: 140 mmol/L (ref 134–144)
eGFR: 39 mL/min/1.73 — ABNORMAL LOW (ref >59)
